# Patient Record
Sex: Female | Born: 1955 | Race: White | Hispanic: No | Marital: Married | Smoking: Never smoker
Health system: Southern US, Community
[De-identification: ages and names within clinical notes are randomized; demographics above are authoritative.]

## PROBLEM LIST (undated history)

## (undated) DIAGNOSIS — M8580 Other specified disorders of bone density and structure, unspecified site: Secondary | ICD-10-CM

## (undated) DIAGNOSIS — E079 Disorder of thyroid, unspecified: Secondary | ICD-10-CM

## (undated) DIAGNOSIS — N393 Stress incontinence (female) (male): Secondary | ICD-10-CM

## (undated) DIAGNOSIS — M1990 Unspecified osteoarthritis, unspecified site: Secondary | ICD-10-CM

## (undated) DIAGNOSIS — Z780 Asymptomatic menopausal state: Secondary | ICD-10-CM

## (undated) DIAGNOSIS — R51 Headache: Secondary | ICD-10-CM

## (undated) DIAGNOSIS — N951 Menopausal and female climacteric states: Secondary | ICD-10-CM

## (undated) DIAGNOSIS — R198 Other specified symptoms and signs involving the digestive system and abdomen: Secondary | ICD-10-CM

## (undated) HISTORY — DX: Unspecified osteoarthritis, unspecified site: M19.90

## (undated) HISTORY — DX: Headache: R51

## (undated) HISTORY — DX: Other specified symptoms and signs involving the digestive system and abdomen: R19.8

## (undated) HISTORY — DX: Asymptomatic menopausal state: Z78.0

## (undated) HISTORY — PX: AUGMENTATION MAMMAPLASTY: SUR837

## (undated) HISTORY — DX: Disorder of thyroid, unspecified: E07.9

## (undated) HISTORY — DX: Other specified disorders of bone density and structure, unspecified site: M85.80

## (undated) HISTORY — PX: BREAST ENHANCEMENT SURGERY: SHX7

## (undated) HISTORY — DX: Stress incontinence (female) (male): N39.3

## (undated) HISTORY — DX: Menopausal and female climacteric states: N95.1

---

## 1998-03-27 ENCOUNTER — Encounter: Admission: RE | Admit: 1998-03-27 | Discharge: 1998-06-25 | Payer: Self-pay | Admitting: Radiation Oncology

## 1998-07-14 ENCOUNTER — Other Ambulatory Visit: Admission: RE | Admit: 1998-07-14 | Discharge: 1998-07-14 | Payer: Self-pay | Admitting: Obstetrics and Gynecology

## 1999-07-31 ENCOUNTER — Other Ambulatory Visit: Admission: RE | Admit: 1999-07-31 | Discharge: 1999-07-31 | Payer: Self-pay | Admitting: Obstetrics and Gynecology

## 2000-01-31 ENCOUNTER — Encounter: Payer: Self-pay | Admitting: Obstetrics and Gynecology

## 2000-01-31 ENCOUNTER — Encounter: Admission: RE | Admit: 2000-01-31 | Discharge: 2000-01-31 | Payer: Self-pay | Admitting: Obstetrics and Gynecology

## 2001-01-14 ENCOUNTER — Other Ambulatory Visit: Admission: RE | Admit: 2001-01-14 | Discharge: 2001-01-14 | Payer: Self-pay | Admitting: Obstetrics and Gynecology

## 2001-02-02 ENCOUNTER — Encounter: Admission: RE | Admit: 2001-02-02 | Discharge: 2001-02-02 | Payer: Self-pay | Admitting: Obstetrics and Gynecology

## 2001-02-02 ENCOUNTER — Encounter: Payer: Self-pay | Admitting: Obstetrics and Gynecology

## 2002-03-12 ENCOUNTER — Encounter: Admission: RE | Admit: 2002-03-12 | Discharge: 2002-03-12 | Payer: Self-pay | Admitting: Obstetrics and Gynecology

## 2002-03-12 ENCOUNTER — Encounter: Payer: Self-pay | Admitting: Obstetrics and Gynecology

## 2002-04-01 ENCOUNTER — Other Ambulatory Visit: Admission: RE | Admit: 2002-04-01 | Discharge: 2002-04-01 | Payer: Self-pay | Admitting: Obstetrics and Gynecology

## 2003-06-28 ENCOUNTER — Other Ambulatory Visit: Admission: RE | Admit: 2003-06-28 | Discharge: 2003-06-28 | Payer: Self-pay | Admitting: Obstetrics and Gynecology

## 2003-06-29 ENCOUNTER — Encounter: Admission: RE | Admit: 2003-06-29 | Discharge: 2003-06-29 | Payer: Self-pay | Admitting: Obstetrics and Gynecology

## 2004-07-30 ENCOUNTER — Other Ambulatory Visit: Admission: RE | Admit: 2004-07-30 | Discharge: 2004-07-30 | Payer: Self-pay | Admitting: Obstetrics and Gynecology

## 2004-08-03 ENCOUNTER — Encounter: Admission: RE | Admit: 2004-08-03 | Discharge: 2004-08-03 | Payer: Self-pay | Admitting: Obstetrics and Gynecology

## 2005-09-19 ENCOUNTER — Encounter: Admission: RE | Admit: 2005-09-19 | Discharge: 2005-09-19 | Payer: Self-pay | Admitting: Obstetrics and Gynecology

## 2006-09-24 ENCOUNTER — Encounter: Admission: RE | Admit: 2006-09-24 | Discharge: 2006-09-24 | Payer: Self-pay | Admitting: Obstetrics and Gynecology

## 2007-11-05 ENCOUNTER — Encounter: Admission: RE | Admit: 2007-11-05 | Discharge: 2007-11-05 | Payer: Self-pay | Admitting: Obstetrics and Gynecology

## 2008-11-30 ENCOUNTER — Encounter: Admission: RE | Admit: 2008-11-30 | Discharge: 2008-11-30 | Payer: Self-pay | Admitting: Obstetrics and Gynecology

## 2009-12-06 ENCOUNTER — Encounter: Admission: RE | Admit: 2009-12-06 | Discharge: 2009-12-06 | Payer: Self-pay | Admitting: Obstetrics and Gynecology

## 2010-05-06 ENCOUNTER — Encounter: Payer: Self-pay | Admitting: Obstetrics and Gynecology

## 2010-09-11 ENCOUNTER — Encounter: Payer: Self-pay | Admitting: Gastroenterology

## 2010-10-22 ENCOUNTER — Other Ambulatory Visit (INDEPENDENT_AMBULATORY_CARE_PROVIDER_SITE_OTHER): Payer: 59

## 2010-10-22 ENCOUNTER — Encounter: Payer: Self-pay | Admitting: Gastroenterology

## 2010-10-22 ENCOUNTER — Ambulatory Visit (INDEPENDENT_AMBULATORY_CARE_PROVIDER_SITE_OTHER): Payer: 59 | Admitting: Gastroenterology

## 2010-10-22 VITALS — BP 92/58 | HR 68 | Ht 69.0 in | Wt 126.0 lb

## 2010-10-22 DIAGNOSIS — R198 Other specified symptoms and signs involving the digestive system and abdomen: Secondary | ICD-10-CM | POA: Insufficient documentation

## 2010-10-22 DIAGNOSIS — K589 Irritable bowel syndrome without diarrhea: Secondary | ICD-10-CM

## 2010-10-22 LAB — IBC PANEL
Saturation Ratios: 31.7 % (ref 20.0–50.0)
Transferrin: 261.4 mg/dL (ref 212.0–360.0)

## 2010-10-22 LAB — CBC WITH DIFFERENTIAL/PLATELET
Eosinophils Absolute: 0.3 10*3/uL (ref 0.0–0.7)
Eosinophils Relative: 4.3 % (ref 0.0–5.0)
HCT: 36.7 % (ref 36.0–46.0)
Lymphs Abs: 1.7 10*3/uL (ref 0.7–4.0)
MCHC: 33.9 g/dL (ref 30.0–36.0)
MCV: 94.1 fl (ref 78.0–100.0)
Monocytes Absolute: 0.5 10*3/uL (ref 0.1–1.0)
Platelets: 216 10*3/uL (ref 150.0–400.0)
RDW: 13.6 % (ref 11.5–14.6)
WBC: 6 10*3/uL (ref 4.5–10.5)

## 2010-10-22 LAB — HEPATIC FUNCTION PANEL
AST: 28 U/L (ref 0–37)
Alkaline Phosphatase: 57 U/L (ref 39–117)
Bilirubin, Direct: 0.1 mg/dL (ref 0.0–0.3)
Total Bilirubin: 0.5 mg/dL (ref 0.3–1.2)

## 2010-10-22 LAB — TSH: TSH: 1.86 u[IU]/mL (ref 0.35–5.50)

## 2010-10-22 LAB — BASIC METABOLIC PANEL
CO2: 29 mEq/L (ref 19–32)
Chloride: 103 mEq/L (ref 96–112)
Potassium: 4.1 mEq/L (ref 3.5–5.1)
Sodium: 142 mEq/L (ref 135–145)

## 2010-10-22 LAB — FERRITIN: Ferritin: 32.9 ng/mL (ref 10.0–291.0)

## 2010-10-22 MED ORDER — PEG-KCL-NACL-NASULF-NA ASC-C 100 G PO SOLR: 1.0000 | Freq: Once | ORAL | Status: DC

## 2010-10-22 NOTE — Progress Notes (Signed)
History of Present Illness:  This is a 55 year old Caucasian female referred by Dr. Richardean Chimera evaluation of mild constipation and occasional tenesmus without rectal bleeding or abdominal pain. She has incomplete rectal emptying with several bowel movements each morning over the last year despite an increased fiber diet. She denies other gastrointestinal or general medical problems. Last colonoscopy was approximately 10 years ago. Family history is remarkable for multiple family members with diverticulosis.  I have reviewed this patient's present history, medical and surgical past history, allergies and medications.     ROS: The remainder of the 10 point ROS is negative     Physical Exam: General well developed well nourished patient in no acute distress, appearing her stated age Eyes PERRLA, no icterus, fundoscopic exam per opthamologist Skin no lesions noted Neck supple, no adenopathy, no thyroid enlargement, no tenderness Chest clear to percussion and auscultation Heart no significant murmurs, gallops or rubs noted Abdomen no hepatosplenomegaly masses or tenderness, BS normal. . Extremities no acute joint lesions, edema, phlebitis or evidence of cellulitis. Neurologic patient oriented x 3, cranial nerves intact, no focal neurologic deficits noted. Psychological mental status normal and normal affect.  Assessment and plan: Probable diverticulosis coli with incomplete rectal emptying. I scheduled her for colonoscopy with propofol sedation since her last colonoscopy required large doses of medications for adequate sedation. Placed on a high-fiber diet with daily Benefiber, and repeat labs which have not been done in several years. Encounter Diagnosis  Name Primary?  . Tenesmus Yes

## 2010-10-22 NOTE — Patient Instructions (Addendum)
Your procedure has been scheduled for 10/23/2010, please follow the seperate instructions.  Your prescription(s) have been sent to you pharmacy.  Please go to the basement today for your labs.  Buy Benefiber OTC and take once a day    High-Fiber Diet A high-fiber diet changes your normal diet to include more whole grains, legumes, fruits, and vegetables. Changes in the diet involve replacing refined carbohydrates with unrefined foods. The calorie level of the diet is essentially unchanged. The Dietary Reference Intake (recommended amount) for adult males is 38 grams per day. For adult females, it is 25 grams per day. Pregnant and lactating women should consume 28 grams of fiber per day. Fiber is the intact part of a plant that is not broken down during digestion. Functional fiber is fiber that has been isolated from the plant to provide a beneficial effect in the body. PURPOSE  Increase stool bulk.   Ease and regulate bowel movements.   Lower cholesterol.  INDICATIONS THAT YOU NEED MORE FIBER  Constipation and hemorrhoids.   Uncomplicated diverticulosis (intestine condition) and irritable bowel syndrome.   Weight management.   As a protective measure against hardening of the arteries (atherosclerosis), diabetes, and cancer.  NOTE OF CAUTION If you have a digestive or bowel problem, ask your caregiver for advice before adding high-fiber foods to your diet. Some of the following medical problems are such that a high-fiber diet should not be used without consulting your caregiver. DO NOT USE WITH:  Acute diverticulitis (intestine infection).   Partial small bowel obstructions.   Complicated diverticular disease involving bleeding, rupture (perforation), or abscess (boil, furuncle).   Presence of autonomic neuropathy (nerve damage) or gastric paresis (stomach cannot empty itself).  GUIDELINES FOR INCREASING FIBER IN THE DIET  Start adding fiber to the diet slowly. A gradual increase  of about 5 more grams (2 slices of whole-wheat bread, 2 servings of most fruits or vegetables, or 1 bowl of high-fiber cereal) per day is best. Too rapid an increase in fiber may result in constipation, flatulence, and bloating.   Drink enough water and fluids to keep your urine clear or pale yellow. Water, juice, or caffeine-free drinks are recommended. Not drinking enough fluid may cause constipation.   Eat a variety of high-fiber foods rather than one type of fiber.   Try to increase your intake of fiber through using high-fiber foods rather than fiber pills or supplements that contain small amounts of fiber.   The goal is to change the types of food eaten. Do not supplement your present diet with high-fiber foods, but replace foods in your present diet.  INCLUDE A VARIETY OF FIBER SOURCES  Replace refined and processed grains with whole grains, canned fruits with fresh fruits, and incorporate other fiber sources. White rice, white breads, and most bakery goods contain little or no fiber.   Brown whole-grain rice, buckwheat oats, and many fruits and vegetables are all good sources of fiber. These include: broccoli, Brussels sprouts, cabbage, cauliflower, beets, sweet potatoes, white potatoes (skin on), carrots, tomatoes, eggplant, squash, berries, fresh fruits, and dried fruits.   Cereals appear to be the richest source of fiber. Cereal fiber is found in whole grains and bran. Bran is the fiber-rich outer coat of cereal grain, which is largely removed in refining. In whole-grain cereals, the bran remains. In breakfast cereals, the largest amount of fiber is found in those with "bran" in their names. The fiber content is sometimes indicated on the label.   You  may need to include additional fruits and vegetables each day.   In baking, for 1 cup white flour, you may use the following substitutions:   1 cup whole-wheat flour minus 2 tablespoons.   1/2 cup white flour plus 1/2 cup whole-wheat  flour.  References: Dietary Reference Intakes: Recommended Intakes for Individuals. [employer]. Institute of Medicine. Food and Nutrition Board. Document Released: 04/01/2005 Document Re-Released: 06/26/2009 [facility] Patient Information 2011 [place], [place].

## 2010-10-23 ENCOUNTER — Encounter: Payer: Self-pay | Admitting: Gastroenterology

## 2010-10-23 ENCOUNTER — Ambulatory Visit (AMBULATORY_SURGERY_CENTER): Payer: 59 | Admitting: Gastroenterology

## 2010-10-23 VITALS — BP 118/73 | HR 62 | Temp 97.7°F | Resp 14 | Ht 69.0 in | Wt 126.0 lb

## 2010-10-23 DIAGNOSIS — K589 Irritable bowel syndrome without diarrhea: Secondary | ICD-10-CM

## 2010-10-23 DIAGNOSIS — R198 Other specified symptoms and signs involving the digestive system and abdomen: Secondary | ICD-10-CM

## 2010-10-23 DIAGNOSIS — R109 Unspecified abdominal pain: Secondary | ICD-10-CM

## 2010-10-23 LAB — CELIAC PANEL 10
Endomysial Screen: NEGATIVE
Gliadin IgG: 12.6 U/mL (ref <20)
Tissue Transglut Ab: 16.4 U/mL (ref <20)
Tissue Transglutaminase Ab, IgA: 4 U/mL (ref <20)

## 2010-10-23 MED ORDER — SODIUM CHLORIDE 0.9 % IV SOLN: 500.0000 mL | INTRAVENOUS | Status: AC

## 2010-10-23 NOTE — Patient Instructions (Signed)
Your next colonoscopy will be in 2022.  We will send you a reminder at that time.   If you have any questions or concerns, please call us at [phone].  Thank-you.

## 2010-10-24 ENCOUNTER — Telehealth: Payer: Self-pay

## 2010-10-24 NOTE — Telephone Encounter (Signed)

## 2010-11-22 ENCOUNTER — Other Ambulatory Visit: Payer: Self-pay | Admitting: Obstetrics and Gynecology

## 2010-11-22 DIAGNOSIS — Z1231 Encounter for screening mammogram for malignant neoplasm of breast: Secondary | ICD-10-CM

## 2010-12-12 ENCOUNTER — Ambulatory Visit
Admission: RE | Admit: 2010-12-12 | Discharge: 2010-12-12 | Disposition: A | Discharge status: Home / Self Care | Payer: BC Managed Care – PPO | Source: Ambulatory Visit | Attending: Obstetrics and Gynecology | Admitting: Obstetrics and Gynecology

## 2010-12-12 DIAGNOSIS — Z1231 Encounter for screening mammogram for malignant neoplasm of breast: Secondary | ICD-10-CM

## 2012-05-05 ENCOUNTER — Other Ambulatory Visit: Payer: Self-pay | Admitting: Internal Medicine

## 2012-05-05 DIAGNOSIS — M79641 Pain in right hand: Secondary | ICD-10-CM

## 2012-05-12 ENCOUNTER — Other Ambulatory Visit: Payer: BC Managed Care – PPO

## 2012-06-03 ENCOUNTER — Ambulatory Visit
Admission: RE | Admit: 2012-06-03 | Discharge: 2012-06-03 | Disposition: A | Discharge status: Home / Self Care | Payer: BC Managed Care – PPO | Source: Ambulatory Visit | Attending: Internal Medicine | Admitting: Internal Medicine

## 2012-06-03 ENCOUNTER — Other Ambulatory Visit: Payer: Self-pay | Admitting: Internal Medicine

## 2012-06-03 DIAGNOSIS — M79641 Pain in right hand: Secondary | ICD-10-CM

## 2012-06-03 MED ORDER — GADOBENATE DIMEGLUMINE 529 MG/ML IV SOLN
11.0000 mL | Freq: Once | INTRAVENOUS | Status: AC | PRN
  Administered 2012-06-03: 11 mL via INTRAVENOUS

## 2014-04-15 HISTORY — PX: MENISCUS REPAIR: SHX5179

## 2015-06-20 ENCOUNTER — Other Ambulatory Visit: Payer: Self-pay | Admitting: Obstetrics and Gynecology

## 2015-06-20 DIAGNOSIS — N644 Mastodynia: Secondary | ICD-10-CM

## 2015-06-23 ENCOUNTER — Other Ambulatory Visit: Payer: Self-pay

## 2015-06-26 ENCOUNTER — Ambulatory Visit
Admission: RE | Admit: 2015-06-26 | Discharge: 2015-06-26 | Disposition: A | Discharge status: Home / Self Care | Payer: BLUE CROSS/BLUE SHIELD | Source: Ambulatory Visit | Attending: Obstetrics and Gynecology | Admitting: Obstetrics and Gynecology

## 2015-06-26 ENCOUNTER — Other Ambulatory Visit: Payer: Self-pay | Admitting: Obstetrics and Gynecology

## 2015-06-26 DIAGNOSIS — N644 Mastodynia: Secondary | ICD-10-CM

## 2015-11-13 ENCOUNTER — Other Ambulatory Visit: Payer: Self-pay | Admitting: Obstetrics and Gynecology

## 2015-11-13 DIAGNOSIS — N6320 Unspecified lump in the left breast, unspecified quadrant: Secondary | ICD-10-CM

## 2015-11-17 ENCOUNTER — Ambulatory Visit
Admission: RE | Admit: 2015-11-17 | Discharge: 2015-11-17 | Disposition: A | Discharge status: Home / Self Care | Payer: BLUE CROSS/BLUE SHIELD | Source: Ambulatory Visit | Attending: Obstetrics and Gynecology | Admitting: Obstetrics and Gynecology

## 2015-11-17 ENCOUNTER — Other Ambulatory Visit: Payer: Self-pay | Admitting: Obstetrics and Gynecology

## 2015-11-17 DIAGNOSIS — N6320 Unspecified lump in the left breast, unspecified quadrant: Secondary | ICD-10-CM

## 2018-01-22 ENCOUNTER — Other Ambulatory Visit: Payer: Self-pay | Admitting: Obstetrics and Gynecology

## 2018-01-22 DIAGNOSIS — R0989 Other specified symptoms and signs involving the circulatory and respiratory systems: Secondary | ICD-10-CM

## 2018-01-28 ENCOUNTER — Other Ambulatory Visit: Payer: Self-pay | Admitting: Obstetrics and Gynecology

## 2018-01-28 DIAGNOSIS — R0989 Other specified symptoms and signs involving the circulatory and respiratory systems: Secondary | ICD-10-CM

## 2018-02-02 ENCOUNTER — Other Ambulatory Visit: Payer: BLUE CROSS/BLUE SHIELD

## 2018-02-05 ENCOUNTER — Ambulatory Visit
Admission: RE | Admit: 2018-02-05 | Discharge: 2018-02-05 | Disposition: A | Discharge status: Home / Self Care | Payer: BLUE CROSS/BLUE SHIELD | Source: Ambulatory Visit | Attending: Obstetrics and Gynecology | Admitting: Obstetrics and Gynecology

## 2018-02-05 ENCOUNTER — Other Ambulatory Visit: Payer: BLUE CROSS/BLUE SHIELD

## 2018-02-05 DIAGNOSIS — R0989 Other specified symptoms and signs involving the circulatory and respiratory systems: Secondary | ICD-10-CM

## 2018-12-02 ENCOUNTER — Other Ambulatory Visit: Payer: Self-pay

## 2018-12-02 DIAGNOSIS — Z20822 Contact with and (suspected) exposure to covid-19: Secondary | ICD-10-CM

## 2018-12-03 LAB — NOVEL CORONAVIRUS, NAA: SARS-CoV-2, NAA: NOT DETECTED

## 2019-02-12 ENCOUNTER — Other Ambulatory Visit: Payer: Self-pay

## 2019-02-12 ENCOUNTER — Ambulatory Visit: Payer: BC Managed Care – PPO | Attending: Neurosurgery | Admitting: Physical Therapy

## 2019-02-12 DIAGNOSIS — M6281 Muscle weakness (generalized): Secondary | ICD-10-CM

## 2019-02-12 DIAGNOSIS — M5442 Lumbago with sciatica, left side: Secondary | ICD-10-CM | POA: Insufficient documentation

## 2019-02-12 NOTE — Therapy (Signed)
[facility] [place], [place], [address] Phone: [phone]   Fax:  [phone]  Physical Therapy Evaluation  Patient Details  Name: Cindy Conner MRN: [id number] Date of Birth: [birth year] Referring Provider (PT): Newman Pies MD   Encounter Date: 02/12/2019  PT End of Session - 02/12/19 0906    Visit Number  1    Number of Visits  12    Date for PT Re-Evaluation  03/26/19    Authorization Type  BCBS    PT Start Time  [phone]    PT Stop Time  0930    PT Time Calculation (min)  34 min    Activity Tolerance  Patient tolerated treatment well    Behavior During Therapy  [facility] for tasks assessed/performed       Past Medical History:  Diagnosis Date  . Headache   . Perimenopausal vasomotor symptoms   . Postmenopausal   . Stress incontinence, female   . Tenesmus     Past Surgical History:  Procedure Laterality Date  . BREAST ENHANCEMENT SURGERY      There were no vitals filed for this visit.   Subjective Assessment - 02/12/19 0859    Subjective  I have the most pain riding in car and standing for long time at my home for work.  Ihave had pain for about 6 months. I tend to work out a lot doing with wts and cardio and plyo metrics    Pertinent History  left knee pain surgery, torn menicus Dr [facility]emi [facility], > 5 years ago    Limitations  Sitting;Standing    How long can you sit comfortably?  10-15 minutes    How long can you stand comfortably?  dull after 1 hour    How long can you walk comfortably?  walk comfortably most of time    Diagnostic tests  no test this session    Patient Stated Goals  to stop nerve    Currently in Pain?  Yes    Pain Score  2    at worst it is a 9/10   Pain Location  Back    Pain Orientation  Left    Pain Descriptors / Indicators  Throbbing;Shooting    Pain Type  Chronic pain    Pain Radiating Towards  toward Left knee    Pain Onset  More than a month ago    Pain Frequency   Intermittent    Aggravating Factors   driving,    Pain Relieving Factors  yoga         [facility] PT Assessment - 02/12/19 0907      Assessment   Medical Diagnosis  Left low back pain with left sciatica    Referring Provider (PT)  Newman Pies MD    Onset Date/Surgical Date  --   49months ago   Hand Dominance  Right    Next MD Visit  no follow up    Prior Therapy  none      Balance Screen   Has the patient fallen in the past 6 months  No    Has the patient had a decrease in activity level because of a fear of falling?   No    Is the patient reluctant to leave their home because of a fear of falling?   No      Home Film/video editor residence    Living Arrangements  Spouse/significant other    Available  Help at Discharge  Family;Friend(s)    Type of [place] to enter    Entrance Stairs-Number of Steps  2    Entrance Stairs-Rails  None    Home Layout  Two level      Prior Function   Level of Independence  Independent      Cognition   Overall Cognitive Status  Within Functional Limits for tasks assessed      Observation/Other Assessments   Focus on Therapeutic Outcomes (FOTO)   FOTO  arrived late and did not give FOTO      Posture/Postural Control   Posture/Postural Control  Postural limitations    Postural Limitations  Posterior pelvic tilt;Decreased lumbar lordosis;Weight shift right    Posture Comments  Left shoulder elevated over right and neck is angled to the right, slightly elevated left pelvis       ROM / Strength   AROM / PROM / Strength  AROM;Strength      AROM   Overall AROM   Deficits    Lumbar Flexion  90% available Range   able to touch the floor no pain   Lumbar Extension  90% available range    Lumbar - Right Side Bend  19 inches from ffinger tip to floor    Lumbar - Left Side Bend  20.5 inches from floor    Lumbar - Right Rotation  75% available range    Lumbar - Left Rotation  75 % available range       Strength   Overall Strength  Deficits    Right Hip Flexion  4+/5    Right Hip Extension  4+/5    Right Hip ABduction  4+/5    Left Hip Flexion  4/5    Left Hip Extension  4-/5    Left Hip ABduction  4-/5    Lumbar Flexion  4+/5    Lumbar Extension  3-/5   can only handle 10 sec for lumbar ext, should be 1 min minim     Flexibility   Soft Tissue Assessment /Muscle Length  yes    Hamstrings  Pt RT 90, LT 80      Palpation   Palpation comment  tenderness over left lumbar paraspinals                Objective measurements completed on examination: See above findings.      [facility] Adult PT Treatment/Exercise - 02/12/19 0907      Knee/Hip Exercises: Stretches   Piriformis Stretch  2 reps;30 seconds;Left;Right    Other Knee/Hip Stretches  sciatic nerve glide supine and in standing. 10 x each.             PT Education - 02/12/19 1224    Education Details  POC Explanation of findings.  Initial HEP    Person(s) Educated  Patient    Methods  Explanation;Demonstration;Tactile cues;Verbal cues;Handout    Comprehension  Verbalized understanding;Returned demonstration       PT Short Term Goals - 02/12/19 0933      PT SHORT TERM GOAL #1   Title  STG=LTG        PT Long Term Goals - 02/12/19 0935      PT LONG TERM GOAL #1   Title  Pt will be independent with advanced HEP.    Baseline  no knowledge    Time  6    Period  Weeks    Status  New  Target Date  03/26/19      PT LONG TERM GOAL #2   Title  Pt will tolerate sitting 1 hour without increased pain to ride in car without increased pain    Baseline  Pt cannot tolerate longer than 10-15 min at eval    Time  6    Period  Weeks    Status  New    Target Date  03/26/19      PT LONG TERM GOAL #3   Title  Pt will improve L/ hip/back extensor/flexor strength to >/= 4+/5 with no exacerbation of pain to promote strength with transitional movements from car to standing    Baseline  Pt unable to tolerate  back extention test for longer than 10 sec.  Pt should be at least a minute,    Time  6    Period  Weeks    Status  New    Target Date  03/26/19      PT LONG TERM GOAL #4   Title  Sit and stand with RT=LT wt bearing to reduce lumbar strain and allow for increased tolerance for these positions for work tasks    Baseline  bears wt in RLE in coming to stand    Time  6    Period  Weeks    Status  New    Target Date  03/26/19             Plan - 02/12/19 1052    Clinical Impression Statement  63 yo female with c/o of increased LBP especially when sitting  and occasional radicular pain into L knee for about 6 months. Pt presents with signs and symptoms compatible with lumbar radiculopathy. Pt with asymmetrical hip/piriformis flexibilty. Pt reports left knee pain but slightly tighter hamstring on LT, but pt with ectomorphic body type and some indications of hypermobilty. Strengthening will be main goal.  Pt also with Pt would benefit from skilled PT for 2 times a week for 6 weeks to address above impariments and functional limitations and return to pain-free PLOF.    Examination-Activity Limitations  Sit    Stability/Clinical Decision Making  Stable/Uncomplicated    Clinical Decision Making  Low    Rehab Potential  Good    PT Frequency  2x / week    PT Duration  6 weeks    PT Treatment/Interventions  Cryotherapy;Therapeutic activities;Electrical Stimulation;Iontophoresis 4mg /ml Dexamethasone;Moist Heat;Traction;Ultrasound;Neuromuscular re-education;Therapeutic exercise;Patient/family education;Manual techniques;Taping;Dry needling;Passive range of motion    PT Next Visit Plan  Give iniital HEP deadlifts and hip hinging  and try TPDN.    PT Home Exercise Plan  sciatic nerve glide supine and standing    Consulted and Agree with Plan of Care  Patient       Patient will benefit from skilled therapeutic intervention in order to improve the following deficits and impairments:  Decreased safety  awareness  Visit Diagnosis: Left-sided low back pain with left-sided sciatica, unspecified chronicity  Muscle weakness (generalized)     Problem List Patient Active Problem List   Diagnosis Date Noted  . Other symptoms involving digestive system(787.99) 10/23/2010  . Tenesmus 10/22/2010  . IBS (irritable bowel syndrome) 10/22/2010   Voncille Lo, PT Certified Exercise Expert for the Aging Adult  02/12/19 12:34 PM Phone: [phone] Fax: [facility] [address] [place], [place], [address] Phone: [phone]   Fax:  [phone]  Name: SYMIA DETEMPLE MRN: [id number] Date of Birth: May 05, 1955

## 2019-02-12 NOTE — Patient Instructions (Signed)
     Voncille Lo, PT Certified Exercise Expert for the Aging Adult  02/12/19 9:32 AM Phone: [phone] Fax: [phone]

## 2019-02-23 ENCOUNTER — Encounter: Payer: Self-pay | Admitting: Physical Therapy

## 2019-02-23 ENCOUNTER — Other Ambulatory Visit: Payer: Self-pay

## 2019-02-23 ENCOUNTER — Ambulatory Visit: Payer: BC Managed Care – PPO | Attending: Neurosurgery | Admitting: Physical Therapy

## 2019-02-23 DIAGNOSIS — M6281 Muscle weakness (generalized): Secondary | ICD-10-CM | POA: Diagnosis present

## 2019-02-23 DIAGNOSIS — M5442 Lumbago with sciatica, left side: Secondary | ICD-10-CM

## 2019-02-23 NOTE — Patient Instructions (Addendum)
Trigger Point Dry Needling  . What is Trigger Point Dry Needling (DN)? o DN is a physical therapy technique used to treat muscle pain and dysfunction. Specifically, DN helps deactivate muscle trigger points (muscle knots).  o A thin filiform needle is used to penetrate the skin and stimulate the underlying trigger point. The goal is for a local twitch response (LTR) to occur and for the trigger point to relax. No medication of any kind is injected during the procedure.   . What Does Trigger Point Dry Needling Feel Like?  o The procedure feels different for each individual patient. Some patients report that they do not actually feel the needle enter the skin and overall the process is not painful. Very mild bleeding may occur. However, many patients feel a deep cramping in the muscle in which the needle was inserted. This is the local twitch response.   Marland Kitchen How Will I feel after the treatment? o Soreness is normal, and the onset of soreness may not occur for a few hours. Typically this soreness does not last longer than two days.  o Bruising is uncommon, however; ice can be used to decrease any possible bruising.  o In rare cases feeling tired or nauseous after the treatment is normal. In addition, your symptoms may get worse before they get better, this period will typically not last longer than 24 hours.   . What Can I do After My Treatment? o Increase your hydration by drinking more water for the next 24 hours. o You may place ice or heat on the areas treated that have become sore, however, do not use heat on inflamed or bruised areas. Heat often brings more relief post needling. o You can continue your regular activities, but vigorous activity is not recommended initially after the treatment for 24 hours. o DN is best combined with other physical therapy such as strengthening, stretching, and other therapies.         Cindy Conner, PT Certified Exercise Expert for the Aging Adult   02/23/19 11:52 AM Phone: [phone] Fax: [phone]

## 2019-02-23 NOTE — Therapy (Signed)
[facility], [place], [address] Phone: [phone]   Fax:  [phone]  Physical Therapy Treatment  Patient Details  Name: Cindy Conner MRN: [id number] Date of Birth: 03-21-1956 Referring Provider (PT): Newman Pies MD   Encounter Date: 02/23/2019  PT End of Session - 02/23/19 1152    Visit Number  2    Number of Visits  12    Date for PT Re-Evaluation  03/26/19    Authorization Type  BCBS    PT Start Time  1151    PT Stop Time  1245    PT Time Calculation (min)  54 min    Activity Tolerance  Patient tolerated treatment well    Behavior During Therapy  [facility] for tasks assessed/performed       Past Medical History:  Diagnosis Date  . Headache(784.0)   . Perimenopausal vasomotor symptoms   . Postmenopausal   . Stress incontinence, female   . Tenesmus     Past Surgical History:  Procedure Laterality Date  . BREAST ENHANCEMENT SURGERY      There were no vitals filed for this visit.                    [facility] Adult PT Treatment/Exercise - 02/23/19 0001      Self-Care   Self-Care  Other Self-Care Comments    Other Self-Care Comments   education on TPDN and aftercare and precautians      Lumbar Exercises: Standing   Other Standing Lumbar Exercises  hip hinge with dowel 2 x 10    Other Standing Lumbar Exercises  deadlift with 25 lb KB x 5, wit 10 lb x 10  with VC and TC for correct execution      Lumbar Exercises: Quadruped   Other Quadruped Lumbar Exercises  camel to childs pose x 10      Knee/Hip Exercises: Stretches   Other Knee/Hip Stretches  sciatic nerve glide supine and in standing. 10 x each.      Modalities   Modalities  Moist Heat      Moist Heat Therapy   Number Minutes Moist Heat  14 Minutes    Moist Heat Location  Lumbar Spine   QL     Manual Therapy   Manual Therapy  Soft tissue mobilization    Manual therapy comments  skilled palpation for TPDN    Soft tissue mobilization  Bil Quadratus Lumborum and bil lumbar paraspinals        Trigger Point Dry Needling - 02/23/19 0001    Consent Given?  Yes    Education Handout Provided  Yes    Muscles Treated Back/Hip  Quadratus lumborum;Erector spinae   Bil   Other Dry Needling  75 mm  60 mm     Erector spinae Response  Twitch response elicited;Palpable increased muscle length   bil L-5/S-1   Quadratus Lumborum Response  Twitch response elicited;Palpable increased muscle length           PT Education - 02/23/19 1236    Education Details  added hip hinge, deadlift and childs pose/camel to HEP education on TPDN    Person(s) Educated  Patient    Methods  Explanation;Demonstration;Tactile cues;Verbal cues;Handout    Comprehension  Verbalized understanding;Returned demonstration       PT Short Term Goals - 02/12/19 0933      PT SHORT TERM GOAL #1   Title  STG=LTG  PT Long Term Goals - 02/12/19 0935      PT LONG TERM GOAL #1   Title  Pt will be independent with advanced HEP.    Baseline  no knowledge    Time  6    Period  Weeks    Status  New    Target Date  03/26/19      PT LONG TERM GOAL #2   Title  Pt will tolerate sitting 1 hour without increased pain to ride in car without increased pain    Baseline  Pt cannot tolerate longer than 10-15 min at eval    Time  6    Period  Weeks    Status  New    Target Date  03/26/19      PT LONG TERM GOAL #3   Title  Pt will improve L/ hip/back extensor/flexor strength to >/= 4+/5 with no exacerbation of pain to promote strength with transitional movements from car to standing    Baseline  Pt unable to tolerate back extention test for longer than 10 sec.  Pt should be at least a minute,    Time  6    Period  Weeks    Status  New    Target Date  03/26/19      PT LONG TERM GOAL #4   Title  Sit and stand with RT=LT wt bearing to reduce lumbar strain and allow for increased tolerance for these positions for work tasks     Baseline  bears wt in RLE in coming to stand    Time  6    Period  Weeks    Status  New    Target Date  03/26/19          Access Code: [email or web address]  URL: [email or web address]  Date: 02/23/2019  Prepared by: Voncille Lo   Exercises  Kettlebell Deadlift - 10 reps - 3 sets - 1x daily - 7x weekly  Standing Hip Hinge with Dowel - 10 reps - 3 sets - 1x daily - 7x weekly  Cat-Camel to Child's Pose - 10 reps - 3 sets - 1x daily - 7x weekly     Plan - 02/23/19 1237    Clinical Impression Statement  Pt returns to cliniic and has c/o of pain from golfing.  No real difference from eval this week.  Added strengthening to HEP and educated on TPDN. Pt consented to TPDN and was closely monitored throughout session.  Pt with decreased muscle tissue tension post session. Will continue POC    Examination-Activity Limitations  Sit    Stability/Clinical Decision Making  Stable/Uncomplicated    Clinical Decision Making  Low    Rehab Potential  Good    PT Frequency  2x / week    PT Treatment/Interventions  Cryotherapy;Therapeutic activities;Electrical Stimulation;Iontophoresis 4mg /ml Dexamethasone;Moist Heat;Traction;Ultrasound;Neuromuscular re-education;Therapeutic exercise;Patient/family education;Manual techniques;Taping;Dry needling;Passive range of motion    PT Next Visit Plan  assess TPDN and add piriformis stretch, review HEP deadlifts/hip hinge    PT Home Exercise Plan  sciatic nerve glide supine and standing   [email or web address]   Consulted and Agree with Plan of Care  Patient       Patient will benefit from skilled therapeutic intervention in order to improve the following deficits and impairments:  Decreased safety awareness  Visit Diagnosis: Left-sided low back pain with left-sided sciatica, unspecified chronicity  Muscle weakness (generalized)     Problem List Patient Active Problem List   Diagnosis Date Noted  . Other symptoms  involving digestive system(787.99)  10/23/2010  . Tenesmus 10/22/2010  . IBS (irritable bowel syndrome) 10/22/2010    Dorothea Ogle 02/23/2019, 12:48 PM  [facility] [address] [place], [place], [address] Phone: [phone]   Fax:  [phone]  Name: Cindy Conner MRN: [id number] Date of Birth: [birth year]

## 2019-02-26 ENCOUNTER — Ambulatory Visit: Payer: BC Managed Care – PPO | Admitting: Physical Therapy

## 2019-02-26 ENCOUNTER — Other Ambulatory Visit: Payer: Self-pay

## 2019-02-26 ENCOUNTER — Encounter: Payer: Self-pay | Admitting: Physical Therapy

## 2019-02-26 DIAGNOSIS — M5442 Lumbago with sciatica, left side: Secondary | ICD-10-CM

## 2019-02-26 DIAGNOSIS — M6281 Muscle weakness (generalized): Secondary | ICD-10-CM

## 2019-02-26 NOTE — Therapy (Signed)
[place], [place], [address] Phone: [phone]   Fax:  [phone]  Physical Therapy Treatment  Patient Details  Name: Cindy Conner MRN: [id number] Date of Birth: 01/24/1956 Referring Provider (PT): Newman Pies MD   Encounter Date: 02/26/2019  PT End of Session - 02/26/19 0806    Visit Number  3    Number of Visits  12    Date for PT Re-Evaluation  03/26/19    Authorization Type  BCBS    PT Start Time  0802    PT Stop Time  0840    PT Time Calculation (min)  38 min       Past Medical History:  Diagnosis Date  . Headache(784.0)   . Perimenopausal vasomotor symptoms   . Postmenopausal   . Stress incontinence, female   . Tenesmus     Past Surgical History:  Procedure Laterality Date  . BREAST ENHANCEMENT SURGERY      There were no vitals filed for this visit.  Subjective Assessment - 02/26/19 0804    Subjective  More right sided pain in low back than on the left. No radiating pain. I was able to play golf without pain after I left the last session.    Currently in Pain?  Yes    Pain Score  2     Pain Location  Back    Pain Orientation  Right    Pain Descriptors / Indicators  Aching    Pain Type  Chronic pain    Aggravating Factors   driving    Pain Relieving Factors  yoga                       [facility] Adult PT Treatment/Exercise - 02/26/19 0001      Lumbar Exercises: Standing   Other Standing Lumbar Exercises  hip hinge with dowel 2 x 10    Other Standing Lumbar Exercises  Deadlift 10# x 10 needs cues for neck alignment, 25# KB x 10 , cues for gluteal squeeze       Lumbar Exercises: Quadruped   Other Quadruped Lumbar Exercises  camel to childs pose x 10      Knee/Hip Exercises: Stretches   Piriformis Stretch  2 reps;30 seconds;Left;Right    Piriformis Stretch Limitations  figure 4 push and pull, given sitting and supine options     Other Knee/Hip Stretches   sciatic nerve glide supine and in standing. 10 x each.             PT Education - 02/26/19 0845    Education Details  HEP    Person(s) Educated  Patient    Methods  Explanation;Handout    Comprehension  Verbalized understanding       PT Short Term Goals - 02/12/19 0933      PT SHORT TERM GOAL #1   Title  STG=LTG        PT Long Term Goals - 02/12/19 0935      PT LONG TERM GOAL #1   Title  Pt will be independent with advanced HEP.    Baseline  no knowledge    Time  6    Period  Weeks    Status  New    Target Date  03/26/19      PT LONG TERM GOAL #2   Title  Pt will tolerate sitting 1 hour without increased pain to ride in car without increased pain  Baseline  Pt cannot tolerate longer than 10-15 min at eval    Time  6    Period  Weeks    Status  New    Target Date  03/26/19      PT LONG TERM GOAL #3   Title  Pt will improve L/ hip/back extensor/flexor strength to >/= 4+/5 with no exacerbation of pain to promote strength with transitional movements from car to standing    Baseline  Pt unable to tolerate back extention test for longer than 10 sec.  Pt should be at least a minute,    Time  6    Period  Weeks    Status  New    Target Date  03/26/19      PT LONG TERM GOAL #4   Title  Sit and stand with RT=LT wt bearing to reduce lumbar strain and allow for increased tolerance for these positions for work tasks    Baseline  bears wt in RLE in coming to stand    Time  6    Period  Weeks    Status  New    Target Date  03/26/19            Plan - 02/26/19 0846    Clinical Impression Statement  Pt arrives reporting decreased pain after TPDN last session. Reviewed HEP and progressed with Piriformis and hamstring stretches. Updated HEP. She required cues for cervical alignment during hip hinge and dead lifting.    PT Next Visit Plan  continue  TPDN and review  piriformis/hamstring stretch, review HEP deadlifts/hip hinge       Patient will benefit from  skilled therapeutic intervention in order to improve the following deficits and impairments:  Decreased safety awareness  Visit Diagnosis: Left-sided low back pain with left-sided sciatica, unspecified chronicity  Muscle weakness (generalized)     Problem List Patient Active Problem List   Diagnosis Date Noted  . Other symptoms involving digestive system(787.99) 10/23/2010  . Tenesmus 10/22/2010  . IBS (irritable bowel syndrome) 10/22/2010    Dorene Ar, PTA 02/26/2019, 8:49 AM  [place], [place], [address] Phone: [phone]   Fax:  [phone]  Name: Cindy Conner MRN: [id number] Date of Birth: 11/11/1955

## 2019-03-02 ENCOUNTER — Other Ambulatory Visit: Payer: Self-pay

## 2019-03-02 ENCOUNTER — Encounter: Payer: Self-pay | Admitting: Physical Therapy

## 2019-03-02 ENCOUNTER — Ambulatory Visit: Payer: BC Managed Care – PPO | Admitting: Physical Therapy

## 2019-03-02 DIAGNOSIS — M6281 Muscle weakness (generalized): Secondary | ICD-10-CM

## 2019-03-02 DIAGNOSIS — M5442 Lumbago with sciatica, left side: Secondary | ICD-10-CM

## 2019-03-02 NOTE — Therapy (Signed)
[place], [place], [address] Phone: [phone]   Fax:  [phone]  Physical Therapy Treatment  Patient Details  Name: Cindy Conner MRN: [id number] Date of Birth: Jun 02, 1955 Referring Provider (PT): Newman Pies MD   Encounter Date: 03/02/2019  PT End of Session - 03/02/19 1035    Visit Number  4    Number of Visits  12    Date for PT Re-Evaluation  03/26/19    Authorization Type  BCBS    PT Start Time  1017    PT Stop Time  1110    PT Time Calculation (min)  53 min    Activity Tolerance  Patient tolerated treatment well    Behavior During Therapy  [facility] for tasks assessed/performed       Past Medical History:  Diagnosis Date  . Headache(784.0)   . Perimenopausal vasomotor symptoms   . Postmenopausal   . Stress incontinence, female   . Tenesmus     Past Surgical History:  Procedure Laterality Date  . BREAST ENHANCEMENT SURGERY      There were no vitals filed for this visit.  Subjective Assessment - 03/02/19 1020    Subjective  No pain on my left side side today,  Today  no pain and I have done a class [employer] 3 yoga. Pain was on Left sciatica  no pain today. but a slight twinge on RT    Pertinent History  left knee pain surgery, torn menicus Dr [facility], > 5 years ago    Limitations  Sitting;Standing    How long can you sit comfortably?  10-15 minutes    How long can you stand comfortably?  dull after 1 hour    How long can you walk comfortably?  walk comfortably most of time    Diagnostic tests  no test this session    Patient Stated Goals  to stop nerve    Currently in Pain?  No/denies    Pain Score  1     Pain Location  Back    Pain Orientation  Right    Pain Descriptors / Indicators  Aching    Pain Type  Chronic pain    Pain Onset  More than a month ago         [facility] PT Assessment - 03/02/19 0001      Assessment   Medical Diagnosis  Left low back pain with left  sciatica    Referring Provider (PT)  Newman Pies MD      Strength   Overall Strength Comments  Back ext off mat at waste maintained for 1 min 20 sec    Right Hip Flexion  5/5    Right Hip Extension  5/5    Right Hip ABduction  5/5    Left Hip Flexion  4+/5    Left Hip Extension  4/5    Left Hip ABduction  4/5    Lumbar Flexion  4+/5    Lumbar Extension  4+/5                   [facility] Adult PT Treatment/Exercise - 03/02/19 0001      Self-Care   Self-Care  Other Self-Care Comments    Other Self-Care Comments   community resources       Lumbar Exercises: Standing   Other Standing Lumbar Exercises  squat with 25#KB    Other Standing Lumbar Exercises  Deadlift 25# x 15 needs  cues for alignment, 25# KB x 10 , cues for gluteal squeeze , on 6 in step Deadlift 30# 5 x rest then 5 x  then on 6 inch step #45 Deadlift 5 x      Lumbar Exercises: Quadruped   Other Quadruped Lumbar Exercises  camel to childs pose x 10      Knee/Hip Exercises: Stretches   Other Knee/Hip Stretches  sciatic nerve glide supine and in standing. 10 x each.      Knee/Hip Exercises: Machines for Strengthening   Total Gym Leg Press  4 plates  30 lb      Moist Heat Therapy   Number Minutes Moist Heat  12 Minutes    Moist Heat Location  Lumbar Spine   prone     Manual Therapy   Manual Therapy  Soft tissue mobilization    Manual therapy comments  skilled palpation for TPDN    Soft tissue mobilization  RT paraspinal and QL       Trigger Point Dry Needling - 03/02/19 0001    Consent Given?  Yes    Education Handout Provided  Previously provided    Muscles Treated Back/Hip  Quadratus lumborum;Erector spinae   RT only   Other Dry Needling  60 mm    Erector spinae Response  Twitch response elicited;Palpable increased muscle length   L4 to S-1   Quadratus Lumborum Response  Twitch response elicited;Palpable increased muscle length             PT Short Term Goals - 02/12/19 0933      PT  SHORT TERM GOAL #1   Title  STG=LTG        PT Long Term Goals - 03/02/19 1048      PT LONG TERM GOAL #1   Title  Pt will be independent with advanced HEP.    Baseline  working on advanced back strengthening    Time  6    Period  Weeks    Status  On-going      PT LONG TERM GOAL #2   Title  Pt will tolerate sitting 1 hour without increased pain to ride in car without increased pain    Baseline  utilizing sciatic nerve glide but sciatica 50% better, can sit at desk longer    Time  6    Period  Weeks    Status  On-going      PT LONG TERM GOAL #3   Title  Pt will improve L/ hip/back extensor/flexor strength to >/= 4+/5 with no exacerbation of pain to promote strength with transitional movements from car to standing no pain with transitional movements    Baseline  10 sec on eval   now 1 minute 20 sec    Time  6    Period  Weeks    Status  Achieved      PT LONG TERM GOAL #4   Title  Sit and stand with RT=LT wt bearing to reduce lumbar strain and allow for increased tolerance for these positions for work tasks    Baseline  able to bear symmetrically    Time  6    Period  Weeks    Status  Achieved            Plan - 03/02/19 1254    Clinical Impression Statement  Pt arrives and reports no sciatic pain in LT but a twinge 1/10 on RT.  Pt is able to maintain back extension over mat  for 1 minute/20 sec showing improvement from 10 sec. Pt LTG's #3 and # 4 achieved. working towards finalizing back HEP and [employer]. Contineu POC and finalize HEP    Examination-Activity Limitations  Sit    Stability/Clinical Decision Making  Stable/Uncomplicated    Clinical Decision Making  Low    Rehab Potential  Good    PT Frequency  2x / week    PT Duration  6 weeks    PT Treatment/Interventions  Cryotherapy;Therapeutic activities;Electrical Stimulation;Iontophoresis 4mg /ml Dexamethasone;Moist Heat;Traction;Ultrasound;Neuromuscular re-education;Therapeutic exercise;Patient/family  education;Manual techniques;Taping;Dry needling;Passive range of motion    PT Next Visit Plan  finalize HEP for home use, gym equipment and DN as needed and return to communtity wellness    PT Home Exercise Plan  sciatic nerve glide supine and standing hip hinge, deadlift    Consulted and Agree with Plan of Care  Patient       Patient will benefit from skilled therapeutic intervention in order to improve the following deficits and impairments:  Decreased safety awareness  Visit Diagnosis: Left-sided low back pain with left-sided sciatica, unspecified chronicity  Muscle weakness (generalized)     Problem List Patient Active Problem List   Diagnosis Date Noted  . Other symptoms involving digestive system(787.99) 10/23/2010  . Tenesmus 10/22/2010  . IBS (irritable bowel syndrome) 10/22/2010    Voncille Lo, PT Certified Exercise Expert for the Aging Adult  03/02/19 1:01 PM Phone: [phone] Fax: [facility] [address] [place], [place], [address] Phone: [phone]   Fax:  [phone]  Name: SAMRAWIT VANDENHEUVEL MRN: [id number] Date of Birth: [birth year]

## 2019-03-04 ENCOUNTER — Ambulatory Visit: Payer: BC Managed Care – PPO | Admitting: Physical Therapy

## 2019-03-04 ENCOUNTER — Encounter: Payer: Self-pay | Admitting: Physical Therapy

## 2019-03-04 ENCOUNTER — Other Ambulatory Visit: Payer: Self-pay

## 2019-03-04 DIAGNOSIS — M5442 Lumbago with sciatica, left side: Secondary | ICD-10-CM | POA: Diagnosis not present

## 2019-03-04 DIAGNOSIS — M6281 Muscle weakness (generalized): Secondary | ICD-10-CM

## 2019-03-04 NOTE — Therapy (Signed)
[place], [place], [address] Phone: [phone]   Fax:  [phone]  Physical Therapy Treatment  Patient Details  Name: Cindy Conner MRN: [id number] Date of Birth: 09/04/1955 Referring Provider (PT): Newman Pies MD   Encounter Date: 03/04/2019  PT End of Session - 03/04/19 1019    Visit Number  5    Number of Visits  12    Date for PT Re-Evaluation  03/26/19    Authorization Type  BCBS    PT Start Time  [id number]    PT Stop Time  1058    PT Time Calculation (min)  40 min       Past Medical History:  Diagnosis Date  . Headache(784.0)   . Perimenopausal vasomotor symptoms   . Postmenopausal   . Stress incontinence, female   . Tenesmus     Past Surgical History:  Procedure Laterality Date  . BREAST ENHANCEMENT SURGERY      There were no vitals filed for this visit.                    [facility] Adult PT Treatment/Exercise - 03/04/19 0001      Lumbar Exercises: Standing   Other Standing Lumbar Exercises  squat with 25#KB    Other Standing Lumbar Exercises  Deadlift 25# x 15 needs cues for alignment, 25# KB x 10 , cues for gluteal squeeze , on 6 in step Deadlift 30# x10 from floor,  then on 6 inch step #45 Deadlift x10, then x 10 from floor        Lumbar Exercises: Quadruped   Other Quadruped Lumbar Exercises  camel to childs pose x 10      Knee/Hip Exercises: Stretches   Active Hamstring Stretch Limitations  supine with strap     Other Knee/Hip Stretches  Review of seated for car ride during holiday, piriformis push/pull, hamstring    Other Knee/Hip Stretches  sciatic nerve glide supine and in standing. 10 x each.      Knee/Hip Exercises: Machines for Strengthening   Cybex Leg Press  supine leg press 3 plates ([id number])     Other Machine  Review of Lat pull, seated row, knee machine and cybex leg press - cues for form and set up provided                PT Short Term  Goals - 02/12/19 0933      PT SHORT TERM GOAL #1   Title  STG=LTG        PT Long Term Goals - 03/02/19 1048      PT LONG TERM GOAL #1   Title  Pt will be independent with advanced HEP.    Baseline  working on advanced back strengthening    Time  6    Period  Weeks    Status  On-going      PT LONG TERM GOAL #2   Title  Pt will tolerate sitting 1 hour without increased pain to ride in car without increased pain    Baseline  utilizing sciatic nerve glide but sciatica 50% better, can sit at desk longer    Time  6    Period  Weeks    Status  On-going      PT LONG TERM GOAL #3   Title  Pt will improve L/ hip/back extensor/flexor strength to >/= 4+/5 with no exacerbation of pain to promote strength with transitional movements from  car to standing no pain with transitional movements    Baseline  10 sec on eval   now 1 minute 20 sec    Time  6    Period  Weeks    Status  Achieved      PT LONG TERM GOAL #4   Title  Sit and stand with RT=LT wt bearing to reduce lumbar strain and allow for increased tolerance for these positions for work tasks    Baseline  able to bear symmetrically    Time  6    Period  Weeks    Status  Achieved            Plan - 03/04/19 1119    Clinical Impression Statement  Pt reports sitting is still the most botersome but she is unsure about long term driving because she has not needed to for prolonged periods. She will go out of town next weekend for [employer]. We reviewed stretches for her a passenger to attempt to prevent sciatica pain. Also progressed with dead lifting and squats with increased reps. She tolerated all well without increased pain.    PT Next Visit Plan  finalize HEP for home use, gym equipment and DN as needed and return to communtity wellness    PT Home Exercise Plan  sciatic nerve glide supine and standing hip hinge, deadlift       Patient will benefit from skilled therapeutic intervention in order to improve the following deficits  and impairments:  Decreased safety awareness  Visit Diagnosis: Left-sided low back pain with left-sided sciatica, unspecified chronicity  Muscle weakness (generalized)     Problem List Patient Active Problem List   Diagnosis Date Noted  . Other symptoms involving digestive system(787.99) 10/23/2010  . Tenesmus 10/22/2010  . IBS (irritable bowel syndrome) 10/22/2010    Dorene Ar, PTA 03/04/2019, 11:23 AM  [place], [place], [address] Phone: [phone]   Fax:  [phone]  Name: BARBARITA COONTZ MRN: [id number] Date of Birth: December 30, 1955

## 2019-03-09 ENCOUNTER — Encounter: Payer: Self-pay | Admitting: Physical Therapy

## 2019-03-09 ENCOUNTER — Other Ambulatory Visit: Payer: Self-pay

## 2019-03-09 ENCOUNTER — Ambulatory Visit: Payer: BC Managed Care – PPO | Admitting: Physical Therapy

## 2019-03-09 DIAGNOSIS — M5442 Lumbago with sciatica, left side: Secondary | ICD-10-CM | POA: Diagnosis not present

## 2019-03-09 DIAGNOSIS — M6281 Muscle weakness (generalized): Secondary | ICD-10-CM

## 2019-03-09 NOTE — Therapy (Signed)
[place], [place], [address] Phone: [phone]   Fax:  [phone]  Physical Therapy Treatment  Patient Details  Name: Cindy Conner MRN: [id number] Date of Birth: Oct 24, 1955 Referring Provider (PT): Newman Pies MD   Encounter Date: 03/09/2019  PT End of Session - 03/09/19 1028    Visit Number  6    Number of Visits  12    Date for PT Re-Evaluation  03/26/19    PT Start Time  1018    PT Stop Time  1113    PT Time Calculation (min)  55 min    Activity Tolerance  Patient tolerated treatment well    Behavior During Therapy  [facility] for tasks assessed/performed       Past Medical History:  Diagnosis Date  . Headache(784.0)   . Perimenopausal vasomotor symptoms   . Postmenopausal   . Stress incontinence, female   . Tenesmus     Past Surgical History:  Procedure Laterality Date  . BREAST ENHANCEMENT SURGERY      There were no vitals filed for this visit.  Subjective Assessment - 03/09/19 1025    Subjective  I was painting a bathroom and I had LT sciatica and then some RT lower back  ( SI) pain afterwards for a couple of days.  I am going on a trip for Thanksgiving    Pertinent History  left knee pain surgery, torn menicus Dr [facility], > 5 years ago    Currently in Pain?  Yes    Pain Score  3     Pain Location  Back    Pain Descriptors / Indicators  Aching    Pain Type  Chronic pain    Pain Onset  More than a month ago    Pain Frequency  Intermittent                       [facility] Adult PT Treatment/Exercise - 03/09/19 0001      Self-Care   Self-Care  Other Self-Care Comments    Other Self-Care Comments   driving and awareness of rotation of spine with movements  and painting.       Lumbar Exercises: Standing   Shoulder Adduction Limitations  full body hip flexor stretch with UE overhead      Lumbar Exercises: Supine   Other Supine Lumbar Exercises  dead bug with alternat  leg /UE isometiric with alternate leg UE resisted iwth green t band and then vice versa       Lumbar Exercises: Quadruped   Other Quadruped Lumbar Exercises  with green t band resisted bird dog 2 x 10 RT and then LT with VC to decrease rotation of back      Moist Heat Therapy   Number Minutes Moist Heat  15 Minutes    Moist Heat Location  Lumbar Spine   prone     Manual Therapy   Manual Therapy  Soft tissue mobilization    Manual therapy comments  skilled palpation for TPDN    Soft tissue mobilization  RT /LT paraspinal and QL and gluts       Trigger Point Dry Needling - 03/09/19 0001    Consent Given?  Yes    Education Handout Provided  Previously provided    Muscles Treated Back/Hip  Lumbar multifidi    Other Dry Needling  21mm    Lumbar multifidi Response  Twitch response elicited;Palpable increased muscle length  L-3 to S-1  RT          PT Education - 03/09/19 1056    Education Details  add to HEP for core strengthening    Person(s) Educated  Patient    Methods  Explanation;Demonstration;Tactile cues;Verbal cues;Handout    Comprehension  Verbalized understanding;Returned demonstration       PT Short Term Goals - 02/12/19 0933      PT SHORT TERM GOAL #1   Title  STG=LTG        PT Long Term Goals - 03/02/19 1048      PT LONG TERM GOAL #1   Title  Pt will be independent with advanced HEP.    Baseline  working on advanced back strengthening    Time  6    Period  Weeks    Status  On-going      PT LONG TERM GOAL #2   Title  Pt will tolerate sitting 1 hour without increased pain to ride in car without increased pain    Baseline  utilizing sciatic nerve glide but sciatica 50% better, can sit at desk longer    Time  6    Period  Weeks    Status  On-going      PT LONG TERM GOAL #3   Title  Pt will improve L/ hip/back extensor/flexor strength to >/= 4+/5 with no exacerbation of pain to promote strength with transitional movements from car to standing no pain  with transitional movements    Baseline  10 sec on eval   now 1 minute 20 sec    Time  6    Period  Weeks    Status  Achieved      PT LONG TERM GOAL #4   Title  Sit and stand with RT=LT wt bearing to reduce lumbar strain and allow for increased tolerance for these positions for work tasks    Baseline  able to bear symmetrically    Time  6    Period  Weeks    Status  Achieved            Plan - 03/09/19 1704    Clinical Impression Statement  Pt reports some pain while sitting still but she does feel stronger.  Pt still progressing with exeriicse.  More aware of body mechnanics this week  Will continue POC    Examination-Activity Limitations  Sit    Stability/Clinical Decision Making  Stable/Uncomplicated    Clinical Decision Making  Low    Rehab Potential  Good    PT Frequency  2x / week    PT Duration  6 weeks    PT Treatment/Interventions  Cryotherapy;Therapeutic activities;Electrical Stimulation;Iontophoresis 4mg /ml Dexamethasone;Moist Heat;Traction;Ultrasound;Neuromuscular re-education;Therapeutic exercise;Patient/family education;Manual techniques;Taping;Dry needling;Passive range of motion    PT Next Visit Plan  finalize HEP for home use, gym equipment and DN as needed and return to communtity wellness    PT Home Exercise Plan  sciatic nerve glide supine and standing hip hinge, deadlift    Consulted and Agree with Plan of Care  Patient       Patient will benefit from skilled therapeutic intervention in order to improve the following deficits and impairments:  Decreased safety awareness  Visit Diagnosis: Left-sided low back pain with left-sided sciatica, unspecified chronicity  Muscle weakness (generalized)     Problem List Patient Active Problem List   Diagnosis Date Noted  . Other symptoms involving digestive system(787.99) 10/23/2010  . Tenesmus 10/22/2010  . IBS (irritable bowel syndrome)  10/22/2010   Voncille Lo, PT Certified Exercise Expert for the  Aging Adult  03/09/19 5:06 PM Phone: [phone] Fax: [facility] [address] [place], [place], [address] Phone: [phone]   Fax:  [phone]  Name: Cindy Conner MRN: [id number] Date of Birth: 03/08/1956

## 2019-03-09 NOTE — Patient Instructions (Signed)
     Voncille Lo, PT Certified Exercise Expert for the Aging Adult  03/09/19 10:56 AM Phone: [phone] Fax: [phone]

## 2019-03-17 ENCOUNTER — Other Ambulatory Visit: Payer: Self-pay | Admitting: Obstetrics and Gynecology

## 2019-03-17 DIAGNOSIS — N63 Unspecified lump in unspecified breast: Secondary | ICD-10-CM

## 2019-03-19 ENCOUNTER — Ambulatory Visit: Payer: BC Managed Care – PPO | Attending: Neurosurgery | Admitting: Physical Therapy

## 2019-03-19 ENCOUNTER — Encounter: Payer: Self-pay | Admitting: Physical Therapy

## 2019-03-19 ENCOUNTER — Other Ambulatory Visit: Payer: Self-pay

## 2019-03-19 DIAGNOSIS — M5442 Lumbago with sciatica, left side: Secondary | ICD-10-CM

## 2019-03-19 DIAGNOSIS — M6281 Muscle weakness (generalized): Secondary | ICD-10-CM | POA: Diagnosis present

## 2019-03-19 NOTE — Therapy (Signed)
[place], [place], [address] Phone: [phone]   Fax:  [phone]  Physical Therapy Treatment  Patient Details  Name: Cindy Conner MRN: [id number] Date of Birth: [birth year] Referring Provider (PT): Newman Pies MD   Encounter Date: 03/19/2019  PT End of Session - 03/19/19 0857    Visit Number  7    Number of Visits  12    Date for PT Re-Evaluation  03/26/19    Authorization Type  BCBS    PT Start Time  0805    PT Stop Time  0850    PT Time Calculation (min)  45 min    Activity Tolerance  Patient tolerated treatment well    Behavior During Therapy  [facility] for tasks assessed/performed       Past Medical History:  Diagnosis Date  . Headache(784.0)   . Perimenopausal vasomotor symptoms   . Postmenopausal   . Stress incontinence, female   . Tenesmus     Past Surgical History:  Procedure Laterality Date  . BREAST ENHANCEMENT SURGERY      There were no vitals filed for this visit.  Subjective Assessment - 03/19/19 0809    Subjective  After 15 minutes, I still have LT side sciatica which is better than initial pain,  not as intense.   My RT side with no pain today.    Pertinent History  left knee pain surgery, torn menicus Dr [facility]emi [facility], > 5 years ago    How long can you sit comfortably?  15-20 min and gets up and walks around    How long can you stand comfortably?  dull after 1 hour    How long can you walk comfortably?  walk comfortably most of time    Diagnostic tests  no test this session    Patient Stated Goals  to stop nerve    Currently in Pain?  Yes    Pain Score  2     Pain Location  Back    Pain Orientation  Left    Pain Descriptors / Indicators  Tightness    Pain Type  Chronic pain    Pain Onset  More than a month ago    Pain Frequency  Intermittent   when sitting   Aggravating Factors   driving and sitting but I get up and move and I willnow you movement strategies          [facility] PT Assessment - 03/19/19 0001      Strength   Overall Strength Comments  Back ext off mat at waste maintained for 1 min 35 sec    Right Hip Flexion  5/5    Right Hip Extension  5/5    Right Hip ABduction  5/5    Left Hip Flexion  5/5    Left Hip Extension  4+/5    Left Hip ABduction  4+/5    Lumbar Flexion  5/5    Lumbar Extension  4+/5                   [facility] Adult PT Treatment/Exercise - 03/19/19 0001      Self-Care   Self-Care  Other Self-Care Comments    Other Self-Care Comments   pain management strategies for car and sitting to alleviate sciatica      Lumbar Exercises: Stretches   Active Hamstring Stretch  5 reps    Active Hamstring Stretch Limitations  deadlift Jefferson curl with 25 #  KB from 8 inch step to 4 inch step  VC and TC for technique    Passive Hamstring Stretch  3 reps;30 seconds    Passive Hamstring Stretch Limitations  standing with LT foot on stool    Lower Trunk Rotation  10 seconds;5 reps    Lower Trunk Rotation Limitations  RT and LT    Pelvic Tilt  10 reps    Press Ups  10 reps    Press Ups Limitations  no difference in pain 2/10 remains    Other Lumbar Stretch Exercise  Q ped thread the needle 10 x and thoracic q ped rotation with elbow toward ceiling.       Lumbar Exercises: Standing   Shoulder Adduction Limitations  standing hip abd LT with ball on wall 10 x 15-20 sec    Other Standing Lumbar Exercises  deadlift ladder KB , 10#, 25 # adn 30# 5 x each x 2 cycles    Other Standing Lumbar Exercises  hip hinge x 10 with 30 # deadlift      Lumbar Exercises: Supine   Bridge with [employer]  20 reps    Bridge with [employer] Limitations  x 2      Knee/Hip Exercises: Machines for Strengthening   Total Gym Leg Press  5 plates  [id number] lb 2 x 15       Manual Therapy   Manual Therapy  Joint mobilization    Joint Mobilization  LT long arm distraction 5 bouts of 30 sec, LT hip AP glide grade 4   decreased sciatica after RX             PT Education - 03/19/19 0856    Education Details  added to HEP for pain management strategy and strength    Person(s) Educated  Patient    Methods  Explanation;Demonstration;Tactile cues;Verbal cues;Handout    Comprehension  Verbalized understanding;Returned demonstration       PT Short Term Goals - 02/12/19 0933      PT SHORT TERM GOAL #1   Title  STG=LTG        PT Long Term Goals - 03/19/19 0907      PT LONG TERM GOAL #1   Title  Pt will be independent with advanced HEP. ( target 03-26-19)    Baseline  Pt now moving on to gym equipment and weights    Time  6    Period  Weeks    Status  On-going      PT LONG TERM GOAL #2   Title  Pt will tolerate sitting 1 hour without increased pain to ride in car without increased pain    Baseline  utilizing sciatic nerve glide but sciatica 50% better, 2/10 pain less intense than eval  walks around 20 minutes    Time  6    Period  Weeks    Status  On-going      PT LONG TERM GOAL #3   Title  Pt will improve L/ hip/back extensor/flexor strength to >/= 4+/5 with no exacerbation of pain to promote strength with transitional movements from car to standing no pain with transitional movements    Baseline  10 sec on eval   now 1 minute 35 sec    Time  6    Period  Weeks    Status  Achieved      PT LONG TERM GOAL #4   Title  Sit and stand with RT=LT wt bearing to reduce  lumbar strain and allow for increased tolerance for these positions for work tasks    Baseline  able to bear symmetrically    Time  6    Period  Weeks    Status  Achieved            Plan - 03/19/19 0909    Clinical Impression Statement  Pt is 2/10 LT sciatica and no pain on RT today.  Pt working on pain management strategies and continuing to strengthen.  Pt able to extend back on edge of plinth 1 min and 35 sec.  Good back extension strength.  Ms Stjulian is an excellent patient and motivated to continue stretngthining after DC with community wellness  suggestions. Pt now leg lift 105 pounds 2 x 15 reps  Continue fo 1-2 visits to finalize HEP and DC    Examination-Activity Limitations  Sit    Stability/Clinical Decision Making  Stable/Uncomplicated    Clinical Decision Making  Low    Rehab Potential  Good    PT Frequency  2x / week    PT Treatment/Interventions  Cryotherapy;Therapeutic activities;Electrical Stimulation;Iontophoresis 4mg /ml Dexamethasone;Moist Heat;Traction;Ultrasound;Neuromuscular re-education;Therapeutic exercise;Patient/family education;Manual techniques;Taping;Dry needling;Passive range of motion    PT Next Visit Plan  finalize HEP for home use, gym equipment and DN as needed and return to communtity wellness DC in 1-2 visits    PT Home Exercise Plan  sciatic nerve glide supine and standing hip hinge, deadlift    Consulted and Agree with Plan of Care  Patient       Patient will benefit from skilled therapeutic intervention in order to improve the following deficits and impairments:  Decreased safety awareness  Visit Diagnosis: Left-sided low back pain with left-sided sciatica, unspecified chronicity  Muscle weakness (generalized)     Problem List Patient Active Problem List   Diagnosis Date Noted  . Other symptoms involving digestive system(787.99) 10/23/2010  . Tenesmus 10/22/2010  . IBS (irritable bowel syndrome) 10/22/2010    Voncille Lo, PT Certified Exercise Expert for the Aging Adult  03/19/19 9:13 AM Phone: [phone] Fax: [place] [address] [place], [place], [address] Phone: [phone]   Fax:  [phone]  Name: Cindy Conner MRN: [id number] Date of Birth: March 06, 1956

## 2019-03-19 NOTE — Patient Instructions (Signed)
  For car ride, try these pain management techniques    Either resist quads on LT (pushing against) or resist hamstrings  After sitting for 15 minutes  Let me know which one or if they are effective      Voncille Lo, PT Certified Exercise Expert for the Aging Adult  03/19/19 8:50 AM Phone: [phone] Fax: [phone]

## 2019-03-23 ENCOUNTER — Ambulatory Visit: Payer: BC Managed Care – PPO | Admitting: Physical Therapy

## 2019-03-23 ENCOUNTER — Other Ambulatory Visit: Payer: Self-pay

## 2019-03-23 DIAGNOSIS — M5442 Lumbago with sciatica, left side: Secondary | ICD-10-CM | POA: Diagnosis not present

## 2019-03-23 DIAGNOSIS — M6281 Muscle weakness (generalized): Secondary | ICD-10-CM

## 2019-03-23 NOTE — Patient Instructions (Signed)
     Voncille Lo, PT Certified Exercise Expert for the Aging Adult  03/23/19 11:26 AM Phone: [phone] Fax: [phone]

## 2019-03-23 NOTE — Therapy (Signed)
[place], [place], [address] Phone: [phone]   Fax:  [phone]  Physical Therapy Treatment  Patient Details  Name: Cindy Conner MRN: [id number] Date of Birth: [birth year] Referring Provider (PT): Newman Pies MD   Encounter Date: 03/23/2019  PT End of Session - 03/23/19 1105    Visit Number  8    Number of Visits  12    Date for PT Re-Evaluation  03/26/19    Authorization Type  BCBS    PT Start Time  1104    PT Stop Time  1155    PT Time Calculation (min)  51 min    Activity Tolerance  Patient tolerated treatment well    Behavior During Therapy  [facility] for tasks assessed/performed       Past Medical History:  Diagnosis Date  . Headache(784.0)   . Perimenopausal vasomotor symptoms   . Postmenopausal   . Stress incontinence, female   . Tenesmus     Past Surgical History:  Procedure Laterality Date  . BREAST ENHANCEMENT SURGERY      There were no vitals filed for this visit.  Subjective Assessment - 03/23/19 1124    Subjective  I am having residual pain in my LT buttock/low back. I havent done any exericise but it is there after standing for 15 minutes.    Pertinent History  left knee pain surgery, torn menicus Dr [facility]emi [facility], > 5 years ago    Diagnostic tests  no test this session    Patient Stated Goals  to stop nerve    Currently in Pain?  Yes    Pain Score  2     Pain Location  Back    Pain Orientation  Left    Pain Descriptors / Indicators  Tightness;Sore    Pain Type  Chronic pain    Pain Radiating Towards  toward back of LT knee    Pain Onset  More than a month ago    Pain Frequency  Intermittent                       [facility] Adult PT Treatment/Exercise - 03/23/19 0001      Lumbar Exercises: Stretches   Lower Trunk Rotation  10 seconds;5 reps    Lower Trunk Rotation Limitations  RT and LT    Pelvic Tilt  10 reps    Other Lumbar Stretch Exercise  Q ped thread  the needle 10 x and thoracic q ped rotation with elbow toward ceiling.       Lumbar Exercises: Supine   Other Supine Lumbar Exercises  figure 4 prirformis stretch 30 sec on LT and then RT      Lumbar Exercises: Quadruped   Other Quadruped Lumbar Exercises  alternating pigeon stretch 10 times RT and then LT    Other Quadruped Lumbar Exercises  prone press up 15 x       Knee/Hip Exercises: Stretches   Other Knee/Hip Stretches  sciatic nerve glide supine and in standing. 10 x each.      Moist Heat Therapy   Number Minutes Moist Heat  15 Minutes    Moist Heat Location  Lumbar Spine   prone     Manual Therapy   Manual Therapy  Joint mobilization    Manual therapy comments  skilled palpation with TPDN    Joint Mobilization  LT long arm distraction 5 bouts of 30 sec, LT hip AP glide  grade 4   decreased sciatica after RX   Soft tissue mobilization  LT gluteal. lumbar region       Trigger Point Dry Needling - 03/23/19 0001    Consent Given?  Yes    Education Handout Provided  Previously provided    Muscles Treated Back/Hip  Lumbar multifidi;Gluteus maximus;Piriformis    Electrical Stimulation Performed with Dry Needling  Yes    E-stim with Dry Needling Details  TENS 25 pps with 30 gage 30 mm    Other Dry Needling  78mm    Gluteus Maximus Response  Twitch response elicited    Lumbar multifidi Response  Twitch response elicited;Palpable increased muscle length             PT Short Term Goals - 02/12/19 0933      PT SHORT TERM GOAL #1   Title  STG=LTG        PT Long Term Goals - 03/19/19 0907      PT LONG TERM GOAL #1   Title  Pt will be independent with advanced HEP. ( target 03-26-19)    Baseline  Pt now moving on to gym equipment and weights    Time  6    Period  Weeks    Status  On-going      PT LONG TERM GOAL #2   Title  Pt will tolerate sitting 1 hour without increased pain to ride in car without increased pain    Baseline  utilizing sciatic nerve glide but  sciatica 50% better, 2/10 pain less intense than eval  walks around 20 minutes    Time  6    Period  Weeks    Status  On-going      PT LONG TERM GOAL #3   Title  Pt will improve L/ hip/back extensor/flexor strength to >/= 4+/5 with no exacerbation of pain to promote strength with transitional movements from car to standing no pain with transitional movements    Baseline  10 sec on eval   now 1 minute 35 sec    Time  6    Period  Weeks    Status  Achieved      PT LONG TERM GOAL #4   Title  Sit and stand with RT=LT wt bearing to reduce lumbar strain and allow for increased tolerance for these positions for work tasks    Baseline  able to bear symmetrically    Time  6    Period  Weeks    Status  Achieved            Plan - 03/23/19 1147    Clinical Impression Statement  Pt with 2/10 sciatica on LT.  Pt consented to TPDN and estim concurrently.  Pt given additional HEP exericses and reviewed some for sciatica.  Pt was able to handle the new exericises with ease and is working towards goal completion and DC    Examination-Activity Limitations  Sit    Stability/Clinical Decision Making  Stable/Uncomplicated    Clinical Decision Making  Low    Rehab Potential  Good    PT Frequency  2x / week    PT Treatment/Interventions  Cryotherapy;Therapeutic activities;Electrical Stimulation;Iontophoresis 4mg /ml Dexamethasone;Moist Heat;Traction;Ultrasound;Neuromuscular re-education;Therapeutic exercise;Patient/family education;Manual techniques;Taping;Dry needling;Passive range of motion    PT Next Visit Plan  finalize HEP for home use, gym equipment and DN as needed and return to communtity wellness DC in 1-2 visits    PT Home Exercise Plan  sciatic nerve glide supine and  standing hip hinge, deadlift, pigeon, prone press up and supine figure 4    Consulted and Agree with Plan of Care  Patient       Patient will benefit from skilled therapeutic intervention in order to improve the following  deficits and impairments:  Decreased safety awareness  Visit Diagnosis: Left-sided low back pain with left-sided sciatica, unspecified chronicity  Muscle weakness (generalized)     Problem List Patient Active Problem List   Diagnosis Date Noted  . Other symptoms involving digestive system(787.99) 10/23/2010  . Tenesmus 10/22/2010  . IBS (irritable bowel syndrome) 10/22/2010    Voncille Lo, PT Certified Exercise Expert for the Aging Adult  03/23/19 1:10 PM Phone: [phone] Fax: [facility] [address] [place], [place], [address] Phone: [phone]   Fax:  [phone]  Name: Cindy Conner MRN: [id number] Date of Birth: [birth year]

## 2019-03-25 ENCOUNTER — Encounter: Payer: Self-pay | Admitting: Physical Therapy

## 2019-03-25 ENCOUNTER — Ambulatory Visit: Payer: BC Managed Care – PPO | Admitting: Physical Therapy

## 2019-03-25 ENCOUNTER — Other Ambulatory Visit: Payer: Self-pay

## 2019-03-25 DIAGNOSIS — M5442 Lumbago with sciatica, left side: Secondary | ICD-10-CM | POA: Diagnosis not present

## 2019-03-25 DIAGNOSIS — M6281 Muscle weakness (generalized): Secondary | ICD-10-CM

## 2019-03-25 NOTE — Patient Instructions (Signed)

## 2019-03-25 NOTE — Therapy (Signed)
Cindy Conner, [place], [address] Phone: [phone]   Fax:  [phone]  Physical Therapy Treatment/Discharge Note  Patient Details  Name: Cindy Conner MRN: [id number] Date of Birth: 1955-09-16 Referring Provider (PT): Cindy Pies MD   Encounter Date: 03/25/2019  PT End of Session - 03/25/19 1112    Visit Number  9    Number of Visits  12    Date for PT Re-Evaluation  03/26/19    Authorization Type  BCBS    PT Start Time  1103    PT Stop Time  1200    PT Time Calculation (min)  57 min    Activity Tolerance  Patient tolerated treatment well    Behavior During Therapy  [facility] for tasks assessed/performed       Past Medical History:  Diagnosis Date  . Headache(784.0)   . Perimenopausal vasomotor symptoms   . Postmenopausal   . Stress incontinence, female   . Tenesmus     Past Surgical History:  Procedure Laterality Date  . BREAST ENHANCEMENT SURGERY      There were no vitals filed for this visit.  Subjective Assessment - 03/25/19 1107    Subjective  I really dont have pain until I drive.  I rode to [facility] for 45 min but coming back I had pain after 20 minutes    Pertinent History  left knee pain surgery, torn menicus Dr [facility], > 5 years ago    Limitations  Sitting;Standing    How long can you sit comfortably?  was able to sit for 45 min to drive to [place]    How long can you stand comfortably?  dull after 1 hour    How long can you walk comfortably?  walk comfortably most of time    Diagnostic tests  no test this session    Patient Stated Goals  to stop nerve    Currently in Pain?  No/denies    Pain Score  0-No pain    Pain Location  Back    Pain Orientation  Left         [facility] PT Assessment - 03/25/19 0001      AROM   Overall AROM   Deficits    Lumbar Flexion  90% available Range   able to touch the floor no pain   Lumbar Extension  90% available range    Lumbar - Right  Side Bend  19 inches from ffinger tip to floor    Lumbar - Left Side Bend  21 inches from floor    Lumbar - Right Rotation  95% available range    Lumbar - Left Rotation  95 % available range      Strength   Overall Strength Comments  Back ext off mat at waste maintained for 1 min 35 sec    Right Hip Flexion  5/5    Right Hip Extension  5/5    Right Hip ABduction  5/5    Left Hip Flexion  5/5    Left Hip Extension  5/5    Left Hip ABduction  4+/5    Lumbar Flexion  5/5    Lumbar Extension  4+/5                   [facility] Adult PT Treatment/Exercise - 03/25/19 0001      Self-Care   Self-Care  ADL's;Lifting;Posture    ADL's  went over handout and pain management ideas  with posture    Lifting  PT demo lifting/ reinforced hip hinge movements    Posture  worked on posture with job and     Other Self-Care Comments   use of lumbar roll for driving and community wellness ideas, review  HEP questions       Knee/Hip Exercises: Stretches   Other Knee/Hip Stretches  sciatic nerve glide supine and in standing. 10 x each.      Moist Heat Therapy   Number Minutes Moist Heat  12 Minutes    Moist Heat Location  Lumbar Spine   prone     Manual Therapy   Manual Therapy  Joint mobilization    Manual therapy comments  skilled palpation with TPDN    Joint Mobilization  LT long arm distraction 5 bouts of 30 sec, LT hip AP glide grade 4   decreased sciatica after RX   Soft tissue mobilization  LT gluteal. lumbar region       Trigger Point Dry Needling - 03/25/19 0001    Consent Given?  Yes    Education Handout Provided  Previously provided    Muscles Treated Back/Hip  Lumbar multifidi;Gluteus maximus;Piriformis    Electrical Stimulation Performed with Dry Needling  Yes    E-stim with Dry Needling Details  TENS 25 pps with 30 gage 30 mm    Gluteus Maximus Response  Twitch response elicited    Piriformis Response  Twitch response elicited;Palpable increased muscle length    Lumbar  multifidi Response  Twitch response elicited   LT L-5 to s 1   Quadratus Lumborum Response  Palpable increased muscle length           PT Education - 03/25/19 1126    Education Details  added posture /body mechanics to HEP, reviewed HEP and community opportunities    Person(s) Educated  Patient    Methods  Explanation;Demonstration;Handout    Comprehension  Verbalized understanding;Returned demonstration       PT Short Term Goals - 02/12/19 0933      PT SHORT TERM GOAL #1   Title  STG=LTG        PT Long Term Goals - 03/25/19 1148      PT LONG TERM GOAL #1   Title  Pt will be independent with advanced HEP. ( target 03-26-19)    Baseline  Pt now moving on to gym equipment and weights and community wellness    Time  6    Period  Weeks    Status  Achieved    Target Date  03/26/19      PT LONG TERM GOAL #2   Title  Pt will tolerate sitting 1 hour without increased pain to ride in car without increased pain    Baseline  Pt with no pain today in clinic able to drive to [place] 45 minutes this week and will benefit from strengthening and lumbar roll for long trips    Time  6    Period  Weeks    Status  Partially Met    Target Date  03/26/19      PT LONG TERM GOAL #3   Title  Pt will improve L/ hip/back extensor/flexor strength to >/= 4+/5 with no exacerbation of pain to promote strength with transitional movements from car to standing no pain with transitional movements    Baseline  back extension maintaining position off matt 1 min 35 sec   4+/5  to 5/5 MMT LE    Time  6    Period  Weeks    Status  Achieved    Target Date  03/26/19      PT LONG TERM GOAL #4   Title  Sit and stand with RT=LT wt bearing to reduce lumbar strain and allow for increased tolerance for these positions for work tasks    Baseline  able to bear symmetrically    Time  6    Period  Weeks    Status  Achieved            Plan - 03/25/19 1258    Clinical Impression Statement  Pt with no  pain this morninig.  Pt educated/reviewed HEP, posture, ADL, lifting and pain management strategies including utilizing a lumbar roll for driving.  Pt FOTO is 6% limitation ( 12 % predicted)  All goals met or partially met.  Pt still has LT low back sensitivity but is able to utilize exericise and has been directed to further strenghthen at community level.  Cindy Conner was a joy for whom to serve and she should continue to do well and be active in the community for a long time    Examination-Activity Limitations  Sit    Stability/Clinical Decision Making  Stable/Uncomplicated    Clinical Decision Making  Low    Rehab Potential  Good    PT Frequency  2x / week    PT Duration  6 weeks    PT Treatment/Interventions  Cryotherapy;Therapeutic activities;Electrical Stimulation;Iontophoresis 2m/ml Dexamethasone;Moist Heat;Traction;Ultrasound;Neuromuscular re-education;Therapeutic exercise;Patient/family education;Manual techniques;Taping;Dry needling;Passive range of motion    PT Next Visit Plan  DC    PT Home Exercise Plan  sciatic nerve glide supine and standing hip hinge, deadlift, pigeon, prone press up and supine figure 4 See HEP    Consulted and Agree with Plan of Care  Patient       Patient will benefit from skilled therapeutic intervention in order to improve the following deficits and impairments:  Decreased safety awareness  Visit Diagnosis: Left-sided low back pain with left-sided sciatica, unspecified chronicity  Muscle weakness (generalized)     Problem List Patient Active Problem List   Diagnosis Date Noted  . Other symptoms involving digestive system(787.99) 10/23/2010  . Tenesmus 10/22/2010  . IBS (irritable bowel syndrome) 10/22/2010    LVoncille Lo PT Certified Exercise Expert for the Aging Adult  03/25/19 1:03 PM Phone: [phone]: [facility] [address]Phone:  [phone]  Fax:  [phone]   Name: Cindy WINBORNEMRN: [id number] of Birth: 8Aug 29, 1957    PHYSICAL THERAPY DISCHARGE SUMMARY  Visits from Start of Care: 9  Current functional level related to goals / functional outcomes: As above   Remaining deficits: Pt with occassional LT side pain but can use movement and lumbar roll to drive in car greater than 45 minutes   Education / Equipment: HEP and community wellness Plan: Patient agrees to discharge.  Patient goals were met. Patient is being discharged due to meeting the stated rehab goals.  ?????    And is pleased with current functional level  LVoncille Lo PT Certified Exercise Expert for the Aging Adult  03/25/19 1:04 PM Phone: [phone]: [phone]

## 2019-03-26 ENCOUNTER — Ambulatory Visit
Admission: RE | Admit: 2019-03-26 | Discharge: 2019-03-26 | Disposition: A | Discharge status: Home / Self Care | Payer: BC Managed Care – PPO | Source: Ambulatory Visit | Attending: Obstetrics and Gynecology | Admitting: Obstetrics and Gynecology

## 2019-03-26 ENCOUNTER — Other Ambulatory Visit: Payer: Self-pay | Admitting: Obstetrics and Gynecology

## 2019-03-26 DIAGNOSIS — N63 Unspecified lump in unspecified breast: Secondary | ICD-10-CM

## 2019-04-26 ENCOUNTER — Other Ambulatory Visit: Payer: Self-pay | Admitting: Obstetrics and Gynecology

## 2019-04-26 DIAGNOSIS — T8549XA Other mechanical complication of breast prosthesis and implant, initial encounter: Secondary | ICD-10-CM

## 2019-05-20 ENCOUNTER — Other Ambulatory Visit: Payer: Self-pay

## 2019-05-20 ENCOUNTER — Ambulatory Visit
Admission: RE | Admit: 2019-05-20 | Discharge: 2019-05-20 | Disposition: A | Discharge status: Home / Self Care | Payer: BC Managed Care – PPO | Source: Ambulatory Visit | Attending: Obstetrics and Gynecology | Admitting: Obstetrics and Gynecology

## 2019-05-20 DIAGNOSIS — T8549XA Other mechanical complication of breast prosthesis and implant, initial encounter: Secondary | ICD-10-CM

## 2020-03-21 ENCOUNTER — Other Ambulatory Visit: Payer: Self-pay | Admitting: Obstetrics and Gynecology

## 2020-03-21 DIAGNOSIS — Z1231 Encounter for screening mammogram for malignant neoplasm of breast: Secondary | ICD-10-CM

## 2020-03-22 ENCOUNTER — Other Ambulatory Visit: Payer: Self-pay | Admitting: Obstetrics and Gynecology

## 2020-03-22 DIAGNOSIS — N6310 Unspecified lump in the right breast, unspecified quadrant: Secondary | ICD-10-CM

## 2020-05-03 ENCOUNTER — Ambulatory Visit
Admission: RE | Admit: 2020-05-03 | Discharge: 2020-05-03 | Disposition: A | Discharge status: Home / Self Care | Payer: BC Managed Care – PPO | Source: Ambulatory Visit | Attending: Obstetrics and Gynecology | Admitting: Obstetrics and Gynecology

## 2020-05-03 ENCOUNTER — Other Ambulatory Visit: Payer: Self-pay

## 2020-05-03 DIAGNOSIS — Z1231 Encounter for screening mammogram for malignant neoplasm of breast: Secondary | ICD-10-CM

## 2020-05-22 ENCOUNTER — Other Ambulatory Visit: Payer: Self-pay

## 2020-05-22 ENCOUNTER — Ambulatory Visit
Admission: RE | Admit: 2020-05-22 | Discharge: 2020-05-22 | Disposition: A | Discharge status: Home / Self Care | Payer: BC Managed Care – PPO | Source: Ambulatory Visit | Attending: Obstetrics and Gynecology | Admitting: Obstetrics and Gynecology

## 2020-05-22 DIAGNOSIS — N6310 Unspecified lump in the right breast, unspecified quadrant: Secondary | ICD-10-CM

## 2020-05-22 MED ORDER — GADOBUTROL 1 MMOL/ML IV SOLN
5.0000 mL | Freq: Once | INTRAVENOUS | Status: AC | PRN
  Administered 2020-05-22: 5 mL via INTRAVENOUS

## 2020-06-01 ENCOUNTER — Other Ambulatory Visit: Payer: Self-pay | Admitting: Obstetrics and Gynecology

## 2020-06-01 DIAGNOSIS — R9389 Abnormal findings on diagnostic imaging of other specified body structures: Secondary | ICD-10-CM

## 2020-06-09 ENCOUNTER — Encounter: Payer: Self-pay | Admitting: Plastic Surgery

## 2020-06-09 ENCOUNTER — Ambulatory Visit (INDEPENDENT_AMBULATORY_CARE_PROVIDER_SITE_OTHER): Payer: BC Managed Care – PPO | Admitting: Plastic Surgery

## 2020-06-09 ENCOUNTER — Other Ambulatory Visit: Payer: Self-pay

## 2020-06-09 DIAGNOSIS — L989 Disorder of the skin and subcutaneous tissue, unspecified: Secondary | ICD-10-CM

## 2020-06-09 NOTE — Progress Notes (Signed)
Patient ID: Cindy Conner, female    DOB: Oct 21, 1955, 65 y.o.   MRN: [id number]   Chief Complaint  Patient presents with  . Skin Problem    The patient is a 65 year old female here for evaluation of her skin.  She was seen by dermatology and referred.  She has a history of skin cancer and Mohs surgery.  She is not a smoker and does not have diabetes.  She is 125 pounds.  She has 2 skin lesions on the right cheek.  Wound has a little scab on it and the other appears to be a deep pore.  I do not see any other concerning lesions.  She has some facial wrinkling and some skin laxity.  She is otherwise in good health.  She has had filler in the past and an upper and lower lid blepharoplasties in [place].   Review of Systems  Constitutional: Negative for activity change and appetite change.  Eyes: Negative.   Respiratory: Negative.  Negative for chest tightness and shortness of breath.   Cardiovascular: Negative.   Gastrointestinal: Negative.   Endocrine: Negative.   Genitourinary: Negative.   Musculoskeletal: Negative.   Skin: Positive for wound. Negative for rash.  Hematological: Negative.   Psychiatric/Behavioral: Negative.     Past Medical History:  Diagnosis Date  . Headache(784.0)   . Perimenopausal vasomotor symptoms   . Postmenopausal   . Stress incontinence, female   . Tenesmus     Past Surgical History:  Procedure Laterality Date  . AUGMENTATION MAMMAPLASTY Bilateral   . BREAST ENHANCEMENT SURGERY        Current Outpatient Medications:  .  ALPRAZolam (XANAX) 0.25 MG tablet, , Disp: , Rfl:  .  cycloSPORINE (RESTASIS) 0.05 % ophthalmic emulsion, Place 1 drop into both eyes 2 (two) times daily.   (Patient not taking: Reported on 06/09/2020), Disp: , Rfl:  .  peg 3350 powder (MOVIPREP) 100 G SOLR, Take 1 kit (100 g total) by mouth once. (Patient not taking: Reported on 06/09/2020), Disp: 1 kit, Rfl: 0 .  tretinoin (RETIN-A) 0.05 % cream, , Disp: , Rfl:   Current  Facility-Administered Medications:  .  0.9 %  sodium chloride infusion, 500 mL, Intravenous, Continuous, Sable Feil, MD   Objective:   Vitals:   06/09/20 1151  BP: 114/69  Pulse: 60  Temp: (!) 97.1 F (36.2 C)  SpO2: 98%    Physical Exam Vitals and nursing note reviewed.  Constitutional:      Appearance: Normal appearance.  HENT:     Head: Normocephalic and atraumatic.  Pulmonary:     Effort: Pulmonary effort is normal.  Musculoskeletal:        General: No swelling, tenderness, deformity or signs of injury.  Skin:    General: Skin is warm.     Capillary Refill: Capillary refill takes less than 2 seconds.     Coloration: Skin is not jaundiced.     Findings: No bruising.  Neurological:     General: No focal deficit present.     Mental Status: She is alert and oriented to person, place, and time.  Psychiatric:        Mood and Affect: Mood normal.        Behavior: Behavior normal.        Thought Content: Thought content normal.     Assessment & Plan:  Skin lesion  We discussed options for excision of one or both of the lesions that she  is concerned about.  She could also do laser treatment that may help.  She would like to wait and see if the lesions improve the appearance before jumping to excision.  I think that that is a good idea.  We will have her talk with Maudie Mercury upfront and see if we can get her scheduled in April.  Pictures were obtained of the patient and placed in the chart with the patient's or guardian's permission.   [place], DO

## 2020-06-19 ENCOUNTER — Other Ambulatory Visit: Payer: Self-pay

## 2020-06-19 ENCOUNTER — Ambulatory Visit
Admission: RE | Admit: 2020-06-19 | Discharge: 2020-06-19 | Disposition: A | Discharge status: Home / Self Care | Payer: Self-pay | Source: Ambulatory Visit | Attending: Obstetrics and Gynecology | Admitting: Obstetrics and Gynecology

## 2020-06-19 DIAGNOSIS — R9389 Abnormal findings on diagnostic imaging of other specified body structures: Secondary | ICD-10-CM

## 2020-08-01 ENCOUNTER — Encounter: Payer: Self-pay | Admitting: Plastic Surgery

## 2020-08-01 ENCOUNTER — Other Ambulatory Visit: Payer: Self-pay

## 2020-08-01 ENCOUNTER — Ambulatory Visit (INDEPENDENT_AMBULATORY_CARE_PROVIDER_SITE_OTHER): Payer: Self-pay | Admitting: Plastic Surgery

## 2020-08-01 DIAGNOSIS — Z719 Counseling, unspecified: Secondary | ICD-10-CM

## 2020-08-29 ENCOUNTER — Other Ambulatory Visit: Payer: Self-pay

## 2020-08-29 ENCOUNTER — Encounter: Payer: Self-pay | Admitting: Plastic Surgery

## 2020-08-29 ENCOUNTER — Ambulatory Visit (INDEPENDENT_AMBULATORY_CARE_PROVIDER_SITE_OTHER): Payer: BC Managed Care – PPO | Admitting: Plastic Surgery

## 2020-08-29 ENCOUNTER — Other Ambulatory Visit (HOSPITAL_COMMUNITY)
Admission: RE | Admit: 2020-08-29 | Discharge: 2020-08-29 | Disposition: A | Discharge status: Home / Self Care | Payer: BC Managed Care – PPO | Source: Ambulatory Visit | Attending: Plastic Surgery | Admitting: Plastic Surgery

## 2020-08-29 VITALS — BP 153/68 | HR 67

## 2020-08-29 DIAGNOSIS — L989 Disorder of the skin and subcutaneous tissue, unspecified: Secondary | ICD-10-CM | POA: Insufficient documentation

## 2020-08-29 NOTE — Progress Notes (Addendum)
Procedure Note  Preoperative Dx: changing skin lesion of right cheek  Postoperative Dx: Same  Procedure: excision of changing skin lesion of right face 3 mm  Anesthesia: Lidocaine 1% with 1:100,000 epinepherine  Indication for Procedure: skin lesion  Description of Procedure: Risks and complications were explained to the patient.  Consent was confirmed and the patient understands the risks and benefits.  The potential complications and alternatives were explained and the patient consents.  The patient expressed understanding the option of not having the procedure and the risks of a scar.  Time out was called and all information was confirmed to be correct.    The area was prepped and drapped.  Lidocaine 1% with epinepherine was injected in the subcutaneous area.  After waiting several minutes for the local to take affect a circular blade was used to excise the area.  The skin edges were reapproximated with 6-0 Monocryl subcuticular running closure.  A dressing was applied.  The patient was given instructions on how to care for the area and a follow up appointment.  Cindy Conner tolerated the procedure well and there were no complications. The specimen was sent to pathology.

## 2020-09-04 ENCOUNTER — Telehealth: Payer: Self-pay

## 2020-09-04 LAB — SURGICAL PATHOLOGY

## 2020-09-04 NOTE — Telephone Encounter (Signed)
Call to pt- no answer/left voicemail requesting call back

## 2020-09-06 ENCOUNTER — Telehealth: Payer: Self-pay

## 2020-09-06 NOTE — Telephone Encounter (Signed)
Pt returned phone call & left message that she understands the pathology report & has no further concerns at this time She has a f/u with [place], [place] on 09/08/20

## 2020-09-08 ENCOUNTER — Other Ambulatory Visit: Payer: Self-pay

## 2020-09-08 ENCOUNTER — Encounter: Payer: Self-pay | Admitting: Surgical

## 2020-09-08 ENCOUNTER — Ambulatory Visit (INDEPENDENT_AMBULATORY_CARE_PROVIDER_SITE_OTHER): Payer: BC Managed Care – PPO | Admitting: Surgical

## 2020-09-08 DIAGNOSIS — L989 Disorder of the skin and subcutaneous tissue, unspecified: Secondary | ICD-10-CM

## 2020-09-08 NOTE — Progress Notes (Signed)
Patient is a 65 year old female here for follow-up after excision of right cheek skin lesion on 08/29/2020 with Dr. Marla Roe.  Biopsy showed dilated pore of [place].  Patient reports that she is overall doing well.  She recently placed new Steri-Strips over the incision.  Everything is healing very nicely.  On exam the right cheek incision is intact.  There is a Monocryl suture still present.  There is no surrounding cellulitic changes.  No bruising or significant swelling noted.  No subcutaneous fluid collections noted.  Her facial function is normal.  Suture knots were removed.  Patient tolerated this fine.  New Steri-Strip was placed.  Recommend leaving this in place for a few days and then she can begin using scar cream.  Recommend following up on an as-needed basis.  Recommend calling with questions or concerns.  There is no sign of infection, seroma, hematoma

## 2020-10-02 ENCOUNTER — Telehealth: Payer: Self-pay

## 2020-10-02 NOTE — Telephone Encounter (Signed)
Patient called to say when we took her stitches out, she had two bumps, one above and one below the stitch line.  Patient said that one went away and the other one is still there.  She said that the bump is about the size of a BB.  She would like to know what it is and is wondering if a stitch could have been left in there.  Please call.

## 2020-10-02 NOTE — Telephone Encounter (Signed)
Returned patients call. Advised the sutures are dissolvable and should disappear within time. Denies any fever, chills, nausea, vomiting, opening of incision, drainage, nor odor. If she continues to feel the bump in a couple of weeks to call our office. Patient understood and agreed with plan.

## 2020-10-20 DIAGNOSIS — Z719 Counseling, unspecified: Secondary | ICD-10-CM | POA: Insufficient documentation

## 2020-10-20 NOTE — Progress Notes (Signed)
   Subjective:    Patient ID: Cindy Conner, female    DOB: 1955-12-22, 65 y.o.   MRN: [id number]  The patient is a 65 yrs old wf here for evaluation of her breasts.  Implant placement in 2015 and 2016.  She is 5 feet 9 inches tall and weighs 120 pounds she had a mammogram done in December of last year which showed dystrophic calcifications in the right breast that were consistent with fat necrosis.  It was not clear whether the implants were intact or ruptured but they do appear to silicone implants.  She had a MRI in February which showed fingerlike projections of silicone off the inferior lateral aspect of the left breast.  Its not clear if it is a leak or not.  The plan was to repeat the MRI in a year.  The patient's history is positive for osteopenia and irritable bowel disease.       Review of Systems  Constitutional: Negative.   HENT: Negative.    Eyes: Negative.   Respiratory: Negative.    Cardiovascular: Negative.   Gastrointestinal: Negative.   Endocrine: Negative.   Genitourinary: Negative.   Neurological: Negative.   Hematological: Negative.   Psychiatric/Behavioral: Negative.        Objective:   Physical Exam Vitals and nursing note reviewed.  Constitutional:      Appearance: Normal appearance.  HENT:     Head: Normocephalic and atraumatic.  Cardiovascular:     Rate and Rhythm: Normal rate.     Pulses: Normal pulses.  Pulmonary:     Effort: Pulmonary effort is normal.  Abdominal:     General: Abdomen is flat. There is no distension.  Skin:    General: Skin is warm.     Capillary Refill: Capillary refill takes less than 2 seconds.     Coloration: Skin is not jaundiced.     Findings: No bruising.  Neurological:     General: No focal deficit present.     Mental Status: She is alert.  Psychiatric:        Mood and Affect: Mood normal.        Behavior: Behavior normal.        Thought Content: Thought content normal.       Assessment & Plan:     ICD-10-CM    1. Encounter for counseling  Z71.9       I have reviewed the mammogram report from February 2022 which showed the inferior lateral corner of the left breast slightly increased in size with radial folds.  The MRI was done 06/23/2020 - no evidence of malignancy in either breast.  Patient is going to think about her options.  Likely repeat films.   Pictures were obtained of the patient and placed in the chart with the patient's or guardian's permission.

## 2020-11-13 ENCOUNTER — Encounter: Payer: Self-pay | Admitting: Gastroenterology

## 2021-01-03 ENCOUNTER — Other Ambulatory Visit: Payer: Self-pay

## 2021-01-03 ENCOUNTER — Ambulatory Visit (AMBULATORY_SURGERY_CENTER): Payer: BC Managed Care – PPO

## 2021-01-03 VITALS — Ht 69.0 in | Wt 120.0 lb

## 2021-01-03 DIAGNOSIS — Z1211 Encounter for screening for malignant neoplasm of colon: Secondary | ICD-10-CM

## 2021-01-03 NOTE — Progress Notes (Signed)
Pre visit completed via phone call; Patient verified name, DOB, and address; No egg or soy allergy known to patient  No issues known to pt with past sedation with any surgeries or procedures Patient denies ever being told they had issues or difficulty with intubation  No FH of Malignant Hyperthermia Pt is not on diet pills Pt is not on  home 02  Pt is not on blood thinners  Pt denies issues with constipation  No A fib or A flutter  EMMI video via MyChart  COVID 19 guidelines implemented in [facility] today with Pt and RN  Pt is fully vaccinated for Covid x 2; NO PA's for preps discussed with pt in [facility] today  Discussed with pt there will be an out-of-pocket cost for prep and that varies from $0 to 70 +  dollars  Due to the COVID-19 pandemic we are asking patients to follow certain guidelines.  Pt aware of COVID protocols and [facility] guidelines

## 2021-01-05 ENCOUNTER — Encounter: Payer: Self-pay | Admitting: Gastroenterology

## 2021-01-17 ENCOUNTER — Other Ambulatory Visit: Payer: Self-pay

## 2021-01-17 ENCOUNTER — Ambulatory Visit (AMBULATORY_SURGERY_CENTER): Payer: BC Managed Care – PPO | Admitting: Gastroenterology

## 2021-01-17 ENCOUNTER — Encounter: Payer: Self-pay | Admitting: Gastroenterology

## 2021-01-17 VITALS — BP 121/67 | HR 64 | Temp 97.8°F | Resp 12 | Ht 69.0 in | Wt 120.0 lb

## 2021-01-17 DIAGNOSIS — Z1211 Encounter for screening for malignant neoplasm of colon: Secondary | ICD-10-CM

## 2021-01-17 DIAGNOSIS — K635 Polyp of colon: Secondary | ICD-10-CM

## 2021-01-17 DIAGNOSIS — D120 Benign neoplasm of cecum: Secondary | ICD-10-CM

## 2021-01-17 MED ORDER — SODIUM CHLORIDE 0.9 % IV SOLN: 500.0000 mL | Freq: Once | INTRAVENOUS | Status: DC

## 2021-01-17 NOTE — Progress Notes (Signed)
Pt's states no medical or surgical changes since previsit or office visit.   Check-in-AER  V/S-SB

## 2021-01-17 NOTE — Progress Notes (Signed)
A and O x3. Report to RN. Tolerated MAC anesthesia well. 

## 2021-01-17 NOTE — Progress Notes (Signed)
   Referring Provider: Chesley Noon, MD Primary Care Physician:  Chesley Noon, MD  Reason for Procedure:  Colon cancer screening   IMPRESSION:  Need for colon cancer surveillance Normal colonoscopy 2003 and 2012 Appropriate candidate for monitored anesthesia care  PLAN: Colonoscopy in the [facility] today   HPI: Cindy Conner is a 65 y.o. female presents for surveillance colonoscopy.  Normal colonoscopy with Dr. Sharlett Iles 05/04/2001 for hemoccult positive stools.  Normal colonoscopy with Dr. Sharlett Iles 10/23/2010 for normal  No baseline GI symptoms.   Maternal first cousin with colon cancer at a young age. No other  known family history of colon cancer or polyps.    Past Medical History:  Diagnosis Date   Arthritis    generalized   Headache(784.0)    Osteopenia    Perimenopausal vasomotor symptoms    Postmenopausal    Stress incontinence, female    Tenesmus    Thyroid disease    on meds    Past Surgical History:  Procedure Laterality Date   AUGMENTATION MAMMAPLASTY Bilateral    MENISCUS REPAIR Left 2016    Current Outpatient Medications  Medication Sig Dispense Refill   ALPRAZolam (XANAX) 0.25 MG tablet Take 0.25 mg by mouth at bedtime as needed.     BIOTIN PO Take 1 tablet by mouth daily at 6 (six) AM.     levothyroxine (SYNTHROID) 25 MCG tablet Take 25 mcg by mouth daily.     MULTIPLE VITAMINS-CALCIUM PO Take 2 tablets by mouth daily at 6 (six) AM.     Multiple Vitamins-Minerals (CENTRUM SILVER 50+WOMEN PO) Take 1 tablet by mouth daily at 6 (six) AM.     NIACIN PO Take 500 mg by mouth daily at 6 (six) AM.     tretinoin (RETIN-A) 0.05 % cream Apply topically at bedtime.     TURMERIC PO Take 1 tablet by mouth daily at 6 (six) AM.     Current Facility-Administered Medications  Medication Dose Route Frequency Provider Last Rate Last Admin   0.9 %  sodium chloride infusion  500 mL Intravenous Continuous Sable Feil, MD        Allergies as of  01/17/2021   (No Known Allergies)    Family History  Problem Relation Age of Onset   Hypertension Father    Hyperlipidemia Father    Colon cancer Cousin    Colon polyps Neg Hx    Esophageal cancer Neg Hx    Rectal cancer Neg Hx    Stomach cancer Neg Hx      Physical Exam: General:   Alert,  well-nourished, pleasant and cooperative in NAD Head:  Normocephalic and atraumatic. Eyes:  Sclera clear, no icterus.   Conjunctiva pink. Mouth:  No deformity or lesions.   Neck:  Supple; no masses or thyromegaly. Lungs:  Clear throughout to auscultation.   No wheezes. Heart:  Regular rate and rhythm; no murmurs. Abdomen:  Soft, non-tender, nondistended, normal bowel sounds, no rebound or guarding.  Msk:  Symmetrical. No boney deformities LAD: No inguinal or umbilical LAD Extremities:  No clubbing or edema. Neurologic:  Alert and  oriented x4;  grossly nonfocal Skin:  No obvious rash or bruise. Psych:  Alert and cooperative. Normal mood and affect.    Warren Kugelman L. Tarri Glenn, MD, MPH 01/17/2021, 8:49 AM

## 2021-01-17 NOTE — Progress Notes (Signed)
Called to room to assist during endoscopic procedure.  Patient ID and intended procedure confirmed with present staff. Received instructions for my participation in the procedure from the performing physician.  

## 2021-01-17 NOTE — Op Note (Addendum)
[facility] Patient Name: Cindy Conner Procedure Date: 01/17/2021 9:18 AM MRN: [id number] Endoscopist: Thornton Park MD, MD Age: 65 Referring MD:  Date of Birth: [birth year] Gender: Female Account #: [id number] Procedure:                Colonoscopy Indications:              Screening for colorectal malignant neoplasm Medicines:                Monitored Anesthesia Care Procedure:                Pre-Anesthesia Assessment:                           - Prior to the procedure, a History and Physical                            was performed, and patient medications and                            allergies were reviewed. The patient's tolerance of                            previous anesthesia was also reviewed. The risks                            and benefits of the procedure and the sedation                            options and risks were discussed with the patient.                            All questions were answered, and informed consent                            was obtained. Prior Anticoagulants: The patient has                            taken no previous anticoagulant or antiplatelet                            agents. ASA Grade Assessment: II - A patient with                            mild systemic disease. After reviewing the risks                            and benefits, the patient was deemed in                            satisfactory condition to undergo the procedure.                           After obtaining informed consent, the colonoscope  was passed under direct vision. Throughout the                            procedure, the patient's blood pressure, pulse, and                            oxygen saturations were monitored continuously. The                            PCF-HQ190L Colonoscope was introduced through the                            anus and advanced to the 3 cm into the ileum. A                            second forward  view of the right colon was                            performed. The colonoscopy was performed with                            moderate difficulty due to a redundant colon.                            Successful completion of the procedure was aided by                            applying abdominal pressure. The patient tolerated                            the procedure well. The quality of the bowel                            preparation was good. The terminal ileum, ileocecal                            valve, appendiceal orifice, and rectum were                            photographed. Scope In: 9:29:55 AM Scope Out: 9:47:18 AM Scope Withdrawal Time: 0 hours 10 minutes 47 seconds  Total Procedure Duration: 0 hours 17 minutes 23 seconds  Findings:                 The perianal and digital rectal examinations were                            normal.                           Non-bleeding internal hemorrhoids were found.                           A few small-mouthed diverticula were found in the  sigmoid colon.                           A less than 1 mm polyp was found in the cecum. The                            polyp was flat. The polyp was removed with a                            piecemeal technique using a cold biopsy forceps.                            Resection and retrieval were complete. Estimated                            blood loss was minimal.                           The exam was otherwise without abnormality on                            direct and retroflexion views. Complications:            No immediate complications. Estimated Blood Loss:     Estimated blood loss: none. Impression:               - Non-bleeding internal hemorrhoids.                           - Very mild diverticulosis in the sigmoid colon.                           - Small cecal polyp. Removed. Recommendation:           - Patient has a contact number available for                             emergencies. The signs and symptoms of potential                            delayed complications were discussed with the                            patient. Return to normal activities tomorrow.                            Written discharge instructions were provided to the                            patient.                           - High fiber diet.                           - Continue present medications.                           -  Await pathology results. Surveillance interval                            will be determined when those results are available.                           - Emerging evidence supports eating a diet of                            fruits, vegetables, grains, calcium, and yogurt                            while reducing red meat and alcohol may reduce the                            risk of colon cancer.                           - Thank you for allowing me to be involved in your                            colon cancer prevention. Thornton Park MD, MD 01/17/2021 9:52:04 AM This report has been signed electronically.

## 2021-01-17 NOTE — Patient Instructions (Signed)
Discharge instructions given. Handouts on polyps,diverticulosis and hemorrhoids. Resume previous medications. YOU HAD AN ENDOSCOPIC PROCEDURE TODAY AT [facility]:   Refer to the procedure report that was given to you for any specific questions about what was found during the examination.  If the procedure report does not answer your questions, please call your gastroenterologist to clarify.  If you requested that your care partner not be given the details of your procedure findings, then the procedure report has been included in a sealed envelope for you to review at your convenience later.  YOU SHOULD EXPECT: Some feelings of bloating in the abdomen. Passage of more gas than usual.  Walking can help get rid of the air that was put into your GI tract during the procedure and reduce the bloating. If you had a lower endoscopy (such as a colonoscopy or flexible sigmoidoscopy) you may notice spotting of blood in your stool or on the toilet paper. If you underwent a bowel prep for your procedure, you may not have a normal bowel movement for a few days.  Please Note:  You might notice some irritation and congestion in your nose or some drainage.  This is from the oxygen used during your procedure.  There is no need for concern and it should clear up in a day or so.  SYMPTOMS TO REPORT IMMEDIATELY:  Following lower endoscopy (colonoscopy or flexible sigmoidoscopy):  Excessive amounts of blood in the stool  Significant tenderness or worsening of abdominal pains  Swelling of the abdomen that is new, acute  Fever of 100F or higher   For urgent or emergent issues, a gastroenterologist can be reached at any hour by calling [phone]. Do not use MyChart messaging for urgent concerns.    DIET:  We do recommend a small meal at first, but then you may proceed to your regular diet.  Drink plenty of fluids but you should avoid alcoholic beverages for 24 hours.  ACTIVITY:  You should  plan to take it easy for the rest of today and you should NOT DRIVE or use heavy machinery until tomorrow (because of the sedation medicines used during the test).    FOLLOW UP: Our staff will call the number listed on your records 48-72 hours following your procedure to check on you and address any questions or concerns that you may have regarding the information given to you following your procedure. If we do not reach you, we will leave a message.  We will attempt to reach you two times.  During this call, we will ask if you have developed any symptoms of COVID 19. If you develop any symptoms (ie: fever, flu-like symptoms, shortness of breath, cough etc.) before then, please call [phone].  If you test positive for Covid 19 in the 2 weeks post procedure, please call and report this information to [place].    If any biopsies were taken you will be contacted by phone or by letter within the next 1-3 weeks.  Please call us at [phone] if you have not heard about the biopsies in 3 weeks.    SIGNATURES/CONFIDENTIALITY: You and/or your care partner have signed paperwork which will be entered into your electronic medical record.  These signatures attest to the fact that that the information above on your After Visit Summary has been reviewed and is understood.  Full responsibility of the confidentiality of this discharge information lies with you and/or your care-partner.

## 2021-01-19 ENCOUNTER — Telehealth: Payer: Self-pay | Admitting: *Deleted

## 2021-01-19 ENCOUNTER — Telehealth: Payer: Self-pay

## 2021-01-19 NOTE — Telephone Encounter (Signed)
  Follow up Call-  Call back number 01/17/2021  Post procedure Call Back phone  # [phone]  Permission to leave phone message Yes  Some recent data might be hidden     Patient questions:  Do you have a fever, pain , or abdominal swelling? No. Pain Score  0 *  Have you tolerated food without any problems? Yes.    Have you been able to return to your normal activities? Yes.    Do you have any questions about your discharge instructions: Diet   No. Medications  No. Follow up visit  No.  Do you have questions or concerns about your Care? No.  Actions: * If pain score is 4 or above: No action needed, pain <4.   Have you developed a fever since your procedure? no  2.   Have you had an respiratory symptoms (SOB or cough) since your procedure? no  3.   Have you tested positive for COVID 19 since your procedure no  4.   Have you had any family members/close contacts diagnosed with the COVID 19 since your procedure?  no   If yes to any of these questions please route to Joylene John, RN and Joella Prince, RN

## 2021-01-19 NOTE — Telephone Encounter (Signed)
Left message on answering machine. 

## 2021-01-21 ENCOUNTER — Encounter: Payer: Self-pay | Admitting: Gastroenterology

## 2021-03-23 ENCOUNTER — Other Ambulatory Visit: Payer: Self-pay | Admitting: Obstetrics and Gynecology

## 2021-03-23 DIAGNOSIS — N6310 Unspecified lump in the right breast, unspecified quadrant: Secondary | ICD-10-CM

## 2021-04-23 ENCOUNTER — Other Ambulatory Visit: Payer: BC Managed Care – PPO

## 2021-05-17 ENCOUNTER — Ambulatory Visit
Admission: RE | Admit: 2021-05-17 | Discharge: 2021-05-17 | Disposition: A | Discharge status: Home / Self Care | Payer: BC Managed Care – PPO | Source: Ambulatory Visit | Attending: Obstetrics and Gynecology | Admitting: Obstetrics and Gynecology

## 2021-05-17 DIAGNOSIS — N6310 Unspecified lump in the right breast, unspecified quadrant: Secondary | ICD-10-CM

## 2021-08-03 ENCOUNTER — Ambulatory Visit: Payer: BC Managed Care – PPO | Admitting: Plastic Surgery

## 2021-10-26 ENCOUNTER — Encounter: Payer: Self-pay | Admitting: Plastic Surgery

## 2021-10-26 ENCOUNTER — Ambulatory Visit (INDEPENDENT_AMBULATORY_CARE_PROVIDER_SITE_OTHER): Payer: BC Managed Care – PPO | Admitting: Plastic Surgery

## 2021-10-26 DIAGNOSIS — L989 Disorder of the skin and subcutaneous tissue, unspecified: Secondary | ICD-10-CM

## 2021-10-26 DIAGNOSIS — Z719 Counseling, unspecified: Secondary | ICD-10-CM

## 2021-10-26 NOTE — Progress Notes (Signed)
Patient ID: Cindy Conner, female    DOB: July 12, 1955, 66 y.o.   MRN: [id number]   Chief Complaint  Patient presents with   Follow-up    The patient is a 66 year old female here for reevaluation of her skin in her breasts.  Her face has healed nicely.  The implants were placed in 2015 or 16.  She had some calcifications that were palpable.  They have been stable and it does not seem to be causing any other problems at this time.  They have a nice appearance and no sign of further issues.    Review of Systems  Constitutional: Negative.   HENT: Negative.    Eyes: Negative.   Respiratory: Negative.    Cardiovascular: Negative.   Gastrointestinal: Negative.   Endocrine: Negative.   Genitourinary: Negative.   Musculoskeletal: Negative.   Skin: Negative.   Hematological: Negative.     Past Medical History:  Diagnosis Date   Arthritis    generalized   Headache(784.0)    Osteopenia    Perimenopausal vasomotor symptoms    Postmenopausal    Stress incontinence, female    Tenesmus    Thyroid disease    on meds    Past Surgical History:  Procedure Laterality Date   AUGMENTATION MAMMAPLASTY Bilateral    MENISCUS REPAIR Left 2016      Current Outpatient Medications:    ALPRAZolam (XANAX) 0.25 MG tablet, Take 0.25 mg by mouth at bedtime as needed., Disp: , Rfl:    BIOTIN PO, Take 1 tablet by mouth daily at 6 (six) AM., Disp: , Rfl:    fluorouracil (EFUDEX) 5 % cream, fluorouracil 5 % topical cream, Disp: , Rfl:    levothyroxine (SYNTHROID) 25 MCG tablet, Take 25 mcg by mouth daily., Disp: , Rfl:    MULTIPLE VITAMINS-CALCIUM PO, Take 2 tablets by mouth daily at 6 (six) AM., Disp: , Rfl:    Multiple Vitamins-Minerals (CENTRUM SILVER 50+WOMEN PO), Take 1 tablet by mouth daily at 6 (six) AM., Disp: , Rfl:    NIACIN PO, Take 500 mg by mouth daily at 6 (six) AM., Disp: , Rfl:    tretinoin (RETIN-A) 0.05 % cream, Apply topically at bedtime., Disp: , Rfl:    TURMERIC PO, Take 1  tablet by mouth daily at 6 (six) AM., Disp: , Rfl:    valACYclovir (VALTREX) 1000 MG tablet, Take 1,000 mg by mouth daily., Disp: , Rfl:   Current Facility-Administered Medications:    0.9 %  sodium chloride infusion, 500 mL, Intravenous, Continuous, Sable Feil, MD   Objective:   There were no vitals filed for this visit.  Physical Exam Constitutional:      Appearance: Normal appearance.  HENT:     Head: Normocephalic.  Cardiovascular:     Rate and Rhythm: Normal rate.     Pulses: Normal pulses.  Pulmonary:     Effort: Pulmonary effort is normal.  Abdominal:     Palpations: Abdomen is soft.  Skin:    General: Skin is warm.     Capillary Refill: Capillary refill takes less than 2 seconds.     Coloration: Skin is not jaundiced.     Findings: No bruising.  Neurological:     Mental Status: She is alert and oriented to person, place, and time.  Psychiatric:        Mood and Affect: Mood normal.        Behavior: Behavior normal.  Thought Content: Thought content normal.     Assessment & Plan:  Encounter for counseling  Skin lesion  Pictures were obtained of the patient and placed in the chart with the patient's or guardian's permission. Continue with massage to the face and breasts. Can continue with skin Uva to any scar areas.  Follow-up as needed.  [place], DO

## 2022-02-19 NOTE — Progress Notes (Deleted)
No chief complaint on file.     History of Present Illness: 66 yo female with history of hypothyroidism who is here today as a new consult, referred by Dr. ***, for the evaluation of tachycardia. ***  Primary Care Physician: Chesley Noon, MD   Past Medical History:  Diagnosis Date   Arthritis    generalized   Headache(784.0)    Osteopenia    Perimenopausal vasomotor symptoms    Postmenopausal    Stress incontinence, female    Tenesmus    Thyroid disease    on meds    Past Surgical History:  Procedure Laterality Date   AUGMENTATION MAMMAPLASTY Bilateral    MENISCUS REPAIR Left 2016    Current Outpatient Medications  Medication Sig Dispense Refill   ALPRAZolam (XANAX) 0.25 MG tablet Take 0.25 mg by mouth at bedtime as needed.     BIOTIN PO Take 1 tablet by mouth daily at 6 (six) AM.     fluorouracil (EFUDEX) 5 % cream fluorouracil 5 % topical cream     levothyroxine (SYNTHROID) 25 MCG tablet Take 25 mcg by mouth daily.     MULTIPLE VITAMINS-CALCIUM PO Take 2 tablets by mouth daily at 6 (six) AM.     Multiple Vitamins-Minerals (CENTRUM SILVER 50+WOMEN PO) Take 1 tablet by mouth daily at 6 (six) AM.     NIACIN PO Take 500 mg by mouth daily at 6 (six) AM.     tretinoin (RETIN-A) 0.05 % cream Apply topically at bedtime.     TURMERIC PO Take 1 tablet by mouth daily at 6 (six) AM.     valACYclovir (VALTREX) 1000 MG tablet Take 1,000 mg by mouth daily.     Current Facility-Administered Medications  Medication Dose Route Frequency Provider Last Rate Last Admin   0.9 %  sodium chloride infusion  500 mL Intravenous Continuous Sable Feil, MD        No Known Allergies  Social History   Socioeconomic History   Marital status: Married    Spouse name: Not on file   Number of children: 2   Years of education: Not on file   Highest education level: Not on file  Occupational History   Occupation: business owner  Tobacco Use   Smoking status: Never    Smokeless tobacco: Never  Vaping Use   Vaping Use: Never used  Substance and Sexual Activity   Alcohol use: Not Currently    Alcohol/week: 3.0 standard drinks of alcohol    Types: 3 Standard drinks or equivalent per week   Drug use: No   Sexual activity: Not on file  Other Topics Concern   Not on file  Social History Narrative   Not on file   Social Determinants of Health   Financial Resource Strain: Not on file  Food Insecurity: Not on file  Transportation Needs: Not on file  Physical Activity: Not on file  Stress: Not on file  Social Connections: Not on file  Intimate Partner Violence: Not on file    Family History  Problem Relation Age of Onset   Hypertension Father    Hyperlipidemia Father    Colon cancer Cousin    Colon polyps Neg Hx    Esophageal cancer Neg Hx    Rectal cancer Neg Hx    Stomach cancer Neg Hx     Review of Systems:  As stated in the HPI and otherwise negative.   There were no vitals taken for this visit.  Physical Examination: General: Well developed, well nourished, NAD  HEENT: OP clear, mucus membranes moist  SKIN: warm, dry. No rashes. Neuro: No focal deficits  Musculoskeletal: Muscle strength 5/5 all ext  Psychiatric: Mood and affect normal  Neck: No JVD, no carotid bruits, no thyromegaly, no lymphadenopathy.  Lungs:Clear bilaterally, no wheezes, rhonci, crackles Cardiovascular: Regular rate and rhythm. No murmurs, gallops or rubs. Abdomen:Soft. Bowel sounds present. Non-tender.  Extremities: No lower extremity edema. Pulses are 2 + in the bilateral DP/PT.  EKG:  EKG {ACTION; IS/IS [id number]} ordered today. The ekg ordered today demonstrates ***  Recent Labs: No results found for requested labs within last 365 days.   Lipid Panel No results found for: "CHOL", "TRIG", "HDL", "CHOLHDL", "VLDL", "LDLCALC", "LDLDIRECT"   Wt Readings from Last 3 Encounters:  01/17/21 120 lb (54.4 kg)  01/03/21 120 lb (54.4 kg)  08/01/20 120 lb  9.6 oz (54.7 kg)      Assessment and Plan:   1.   Labs/ tests ordered today include:  No orders of the defined types were placed in this encounter.    Disposition:   F/U with me in ***    Signed, Lauree Chandler, MD, [facility] 02/19/2022 5:18 PM    [facility] [address], [place], [place]  [address] Phone: [phone]; Fax: [phone]

## 2022-02-20 ENCOUNTER — Ambulatory Visit: Payer: BC Managed Care – PPO | Attending: Cardiovascular Disease | Admitting: Cardiovascular Disease

## 2022-04-16 ENCOUNTER — Other Ambulatory Visit: Payer: Self-pay | Admitting: Obstetrics and Gynecology

## 2022-04-16 DIAGNOSIS — Z1231 Encounter for screening mammogram for malignant neoplasm of breast: Secondary | ICD-10-CM

## 2022-04-21 NOTE — Progress Notes (Unsigned)
No chief complaint on file.  History of Present Illness: 67 yo female with history of hypothyroidism who is here today as a new consult, referred by Dr. ***, for the evaluation of ***  Primary Care Physician: Chesley Noon, MD   Past Medical History:  Diagnosis Date   Arthritis    generalized   Headache(784.0)    Osteopenia    Perimenopausal vasomotor symptoms    Postmenopausal    Stress incontinence, female    Tenesmus    Thyroid disease    on meds    Past Surgical History:  Procedure Laterality Date   AUGMENTATION MAMMAPLASTY Bilateral    MENISCUS REPAIR Left 2016    Current Outpatient Medications  Medication Sig Dispense Refill   ALPRAZolam (XANAX) 0.25 MG tablet Take 0.25 mg by mouth at bedtime as needed.     BIOTIN PO Take 1 tablet by mouth daily at 6 (six) AM.     fluorouracil (EFUDEX) 5 % cream fluorouracil 5 % topical cream     levothyroxine (SYNTHROID) 25 MCG tablet Take 25 mcg by mouth daily.     MULTIPLE VITAMINS-CALCIUM PO Take 2 tablets by mouth daily at 6 (six) AM.     Multiple Vitamins-Minerals (CENTRUM SILVER 50+WOMEN PO) Take 1 tablet by mouth daily at 6 (six) AM.     NIACIN PO Take 500 mg by mouth daily at 6 (six) AM.     tretinoin (RETIN-A) 0.05 % cream Apply topically at bedtime.     TURMERIC PO Take 1 tablet by mouth daily at 6 (six) AM.     valACYclovir (VALTREX) 1000 MG tablet Take 1,000 mg by mouth daily.     Current Facility-Administered Medications  Medication Dose Route Frequency Provider Last Rate Last Admin   0.9 %  sodium chloride infusion  500 mL Intravenous Continuous Sable Feil, MD        No Known Allergies  Social History   Socioeconomic History   Marital status: Married    Spouse name: Not on file   Number of children: 2   Years of education: Not on file   Highest education level: Not on file  Occupational History   Occupation: business owner  Tobacco Use   Smoking status: Never   Smokeless tobacco: Never   Vaping Use   Vaping Use: Never used  Substance and Sexual Activity   Alcohol use: Not Currently    Alcohol/week: 3.0 standard drinks of alcohol    Types: 3 Standard drinks or equivalent per week   Drug use: No   Sexual activity: Not on file  Other Topics Concern   Not on file  Social History Narrative   Not on file   Social Determinants of Health   Financial Resource Strain: Not on file  Food Insecurity: Not on file  Transportation Needs: Not on file  Physical Activity: Not on file  Stress: Not on file  Social Connections: Not on file  Intimate Partner Violence: Not on file    Family History  Problem Relation Age of Onset   Hypertension Father    Hyperlipidemia Father    Colon cancer Cousin    Colon polyps Neg Hx    Esophageal cancer Neg Hx    Rectal cancer Neg Hx    Stomach cancer Neg Hx     Review of Systems:  As stated in the HPI and otherwise negative.   There were no vitals taken for this visit.  Physical Examination: General: Well  developed, well nourished, NAD  HEENT: OP clear, mucus membranes moist  SKIN: warm, dry. No rashes. Neuro: No focal deficits  Musculoskeletal: Muscle strength 5/5 all ext  Psychiatric: Mood and affect normal  Neck: No JVD, no carotid bruits, no thyromegaly, no lymphadenopathy.  Lungs:Clear bilaterally, no wheezes, rhonci, crackles Cardiovascular: Regular rate and rhythm. No murmurs, gallops or rubs. Abdomen:Soft. Bowel sounds present. Non-tender.  Extremities: No lower extremity edema. Pulses are 2 + in the bilateral DP/PT.  EKG:  EKG {ACTION; IS/IS [id number]} ordered today. The ekg ordered today demonstrates ***  Recent Labs: No results found for requested labs within last 365 days.   Lipid Panel No results found for: "CHOL", "TRIG", "HDL", "CHOLHDL", "VLDL", "LDLCALC", "LDLDIRECT"   Wt Readings from Last 3 Encounters:  01/17/21 54.4 kg  01/03/21 54.4 kg  08/01/20 54.7 kg      Assessment and Plan:   1.    Labs/ tests ordered today include:  No orders of the defined types were placed in this encounter.    Disposition:   F/U with me in ***    Signed, Lauree Chandler, MD, [facility] 04/21/2022 1:48 PM    [facility] [place], [place], [place]  [address] Phone: [phone]; Fax: [phone]

## 2022-04-22 ENCOUNTER — Ambulatory Visit: Payer: Managed Care, Other (non HMO) | Attending: Cardiovascular Disease | Admitting: Cardiovascular Disease

## 2022-04-22 ENCOUNTER — Encounter: Payer: Self-pay | Admitting: Cardiovascular Disease

## 2022-04-22 ENCOUNTER — Ambulatory Visit: Payer: Managed Care, Other (non HMO) | Attending: Cardiovascular Disease

## 2022-04-22 VITALS — BP 118/84 | HR 62 | Ht 69.0 in | Wt 116.6 lb

## 2022-04-22 DIAGNOSIS — R002 Palpitations: Secondary | ICD-10-CM | POA: Diagnosis not present

## 2022-04-22 NOTE — Patient Instructions (Addendum)
Medication Instructions:  No changes *If you need a refill on your cardiac medications before your next appointment, please call your pharmacy*   Lab Work: none   Testing/Procedures: 14 Day Zio Patch - see instructions below  Your physician has requested that you have an echocardiogram. Echocardiography is a painless test that uses sound waves to create images of your heart. It provides your doctor with information about the size and shape of your heart and how well your heart's chambers and valves are working. This procedure takes approximately one hour. There are no restrictions for this procedure. Please do NOT wear cologne, perfume, aftershave, or lotions (deodorant is allowed). Please arrive 15 minutes prior to your appointment time.   Follow-Up: As needed   ZIO XT- Long Term Monitor Instructions  Your physician has requested you wear a ZIO patch monitor for 14 days.  This is a single patch monitor. [employer] supplies one patch monitor per enrollment. Additional stickers are not available. Please do not apply patch if you will be having a Nuclear Stress Test,  Echocardiogram, Cardiac CT, MRI, or Chest Xray during the period you would be wearing the  monitor. The patch cannot be worn during these tests. You cannot remove and re-apply the  ZIO XT patch monitor.  Your ZIO patch monitor will be mailed 3 day USPS to your address on file. It may take 3-5 days  to receive your monitor after you have been enrolled.  Once you have received your monitor, please review the enclosed instructions. Your monitor  has already been registered assigning a specific monitor serial # to you.  Billing and Patient Assistance Program Information  We have supplied [employer] with any of your insurance information on file for billing purposes. [employer] offers a sliding scale Patient Assistance Program for patients that do not have  insurance, or whose insurance does not completely cover the cost of the  ZIO monitor.  You must apply for the Patient Assistance Program to qualify for this discounted rate.  To apply, please call [employer] at [phone], select option 4, select option 2, ask to apply for  Patient Assistance Program. Theodore Demark will ask your household income, and how many people  are in your household. They will quote your out-of-pocket cost based on that information.  [employer] will also be able to set up a 80-month interest-free payment plan if needed.  Applying the monitor   Shave hair from upper left chest.  Hold abrader disc by orange tab. Rub abrader in 40 strokes over the upper left chest as  indicated in your monitor instructions.  Clean area with 4 enclosed alcohol pads. Let dry.  Apply patch as indicated in monitor instructions. Patch will be placed under collarbone on left  side of chest with arrow pointing upward.  Rub patch adhesive wings for 2 minutes. Remove white label marked "1". Remove the white  label marked "2". Rub patch adhesive wings for 2 additional minutes.  While looking in a mirror, press and release button in center of patch. A small green light will  flash 3-4 times. This will be your only indicator that the monitor has been turned on.  Do not shower for the first 24 hours. You may shower after the first 24 hours.  Press the button if you feel a symptom. You will hear a small click. Record Date, Time and  Symptom in the Patient Logbook.  When you are ready to remove the patch, follow instructions on the last 2 pages of  Patient  Logbook. Stick patch monitor onto the last page of Patient Logbook.  Place Patient Logbook in the blue and white box. Use locking tab on box and tape box closed  securely. The blue and white box has prepaid postage on it. Please place it in the mailbox as  soon as possible. Your physician should have your test results approximately 7 days after the  monitor has been mailed back to [facility].  Call [place] at [phone] if you have questions regarding  your ZIO XT patch monitor. Call them immediately if you see an orange light blinking on your  monitor.  If your monitor falls off in less than 4 days, contact our Monitor department at [phone].  If your monitor becomes loose or falls off after 4 days call [employer] at [phone] for  suggestions on securing your monitor

## 2022-04-22 NOTE — Progress Notes (Unsigned)
Enrolled for [employer] to mail a ZIO XT long term holter monitor to the patients address on file.  

## 2022-04-23 ENCOUNTER — Ambulatory Visit (HOSPITAL_COMMUNITY): Payer: Managed Care, Other (non HMO) | Attending: Cardiovascular Disease

## 2022-04-23 DIAGNOSIS — I351 Nonrheumatic aortic (valve) insufficiency: Secondary | ICD-10-CM | POA: Diagnosis not present

## 2022-04-23 DIAGNOSIS — R002 Palpitations: Secondary | ICD-10-CM | POA: Insufficient documentation

## 2022-04-23 LAB — ECHOCARDIOGRAM COMPLETE
Area-P 1/2: 2.88 cm2
P 1/2 time: 458 msec
S' Lateral: 3 cm

## 2022-04-25 DIAGNOSIS — R002 Palpitations: Secondary | ICD-10-CM

## 2022-04-26 ENCOUNTER — Telehealth: Payer: Self-pay | Admitting: Cardiovascular Disease

## 2022-04-26 NOTE — Telephone Encounter (Signed)
Patient returned RN's call regarding results.

## 2022-04-29 NOTE — Telephone Encounter (Signed)
Left message for patient to call back.

## 2022-05-06 NOTE — Telephone Encounter (Signed)
  Patient is returning call regarding results

## 2022-05-06 NOTE — Telephone Encounter (Signed)
Reviewed results and recommendations/comments from Dr Angelena Form. Patient verbalized understanding and had no questions.

## 2022-05-17 ENCOUNTER — Telehealth: Payer: Self-pay | Admitting: *Deleted

## 2022-05-17 MED ORDER — DILTIAZEM HCL 30 MG PO TABS: 30.0000 mg | ORAL_TABLET | Freq: Four times a day (QID) | ORAL | 1 refills | Status: AC | PRN

## 2022-05-17 NOTE — Telephone Encounter (Signed)
-----   Message from Burnell Blanks, MD sent at 05/15/2022 11:42 AM EST ----- Several brief runs of SVT. Can we let her know that this is benign. She may feel her heart race when this happens. Nothing lasted over several seconds. We can give her Cardizem 30 mg to use as needed for palpitations. Also review vagal maneuvers that she can use if she feels her heart racing (bear down and cough deeply). Thanks, chris

## 2022-05-17 NOTE — Telephone Encounter (Signed)
Reviewed with patient.  All questions/concerns have been addressed.  She will call back if anything further is needed.  Cardizem sent #30 w one refill.

## 2022-06-05 ENCOUNTER — Ambulatory Visit
Admission: RE | Admit: 2022-06-05 | Discharge: 2022-06-05 | Disposition: A | Discharge status: Home / Self Care | Payer: Managed Care, Other (non HMO) | Source: Ambulatory Visit | Attending: Obstetrics and Gynecology | Admitting: Obstetrics and Gynecology

## 2022-06-05 DIAGNOSIS — Z1231 Encounter for screening mammogram for malignant neoplasm of breast: Secondary | ICD-10-CM

## 2022-09-01 IMAGING — MG MM  DIGITAL DIAGNOSTIC BREAST BILAT IMPLANT W/ TOMO W/ CAD
8 of 12 series · 8 of 28 positions shown · non-contrast
Comparison: Previous exam(s).

CLINICAL DATA: Patient and provider note "multiple nodules"
enlarging in the right breast. Known large dystrophic calcifications
in the right breast, documented on prior diagnostic exam 03/26/2019.

EXAM:
DIGITAL DIAGNOSTIC BILATERAL MAMMOGRAM WITH IMPLANTS, CAD AND
TOMOSYNTHESIS; ULTRASOUND RIGHT BREAST LIMITED
TECHNIQUE: Bilateral digital diagnostic mammography and breast tomosynthesis
was performed. The images were evaluated with computer-aided
detection. Standard and/or implant displaced views were performed.;
Targeted ultrasound examination of the right breast was performed

[R MLO]
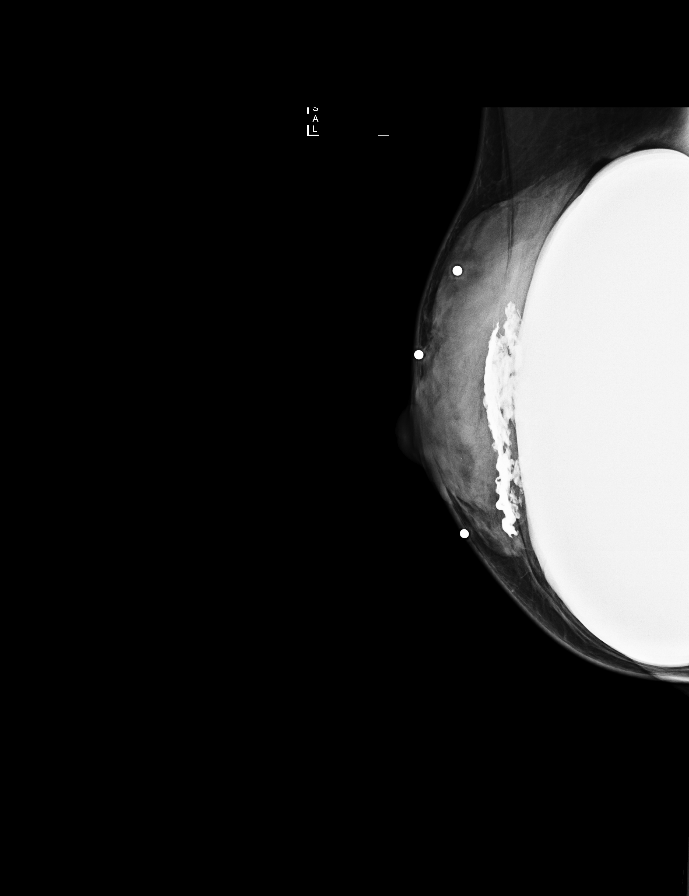

[L CC]
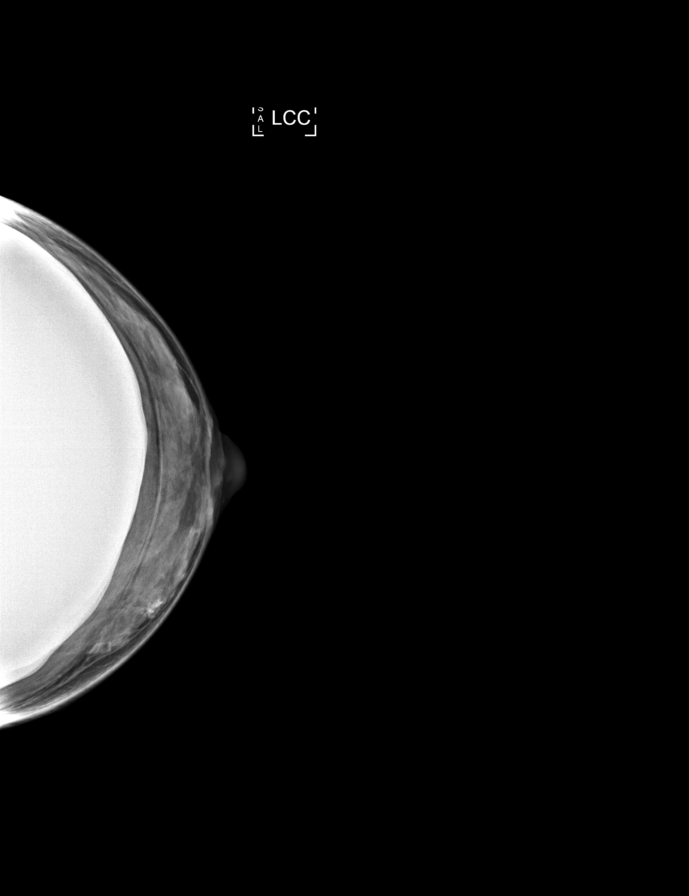

[R CC]
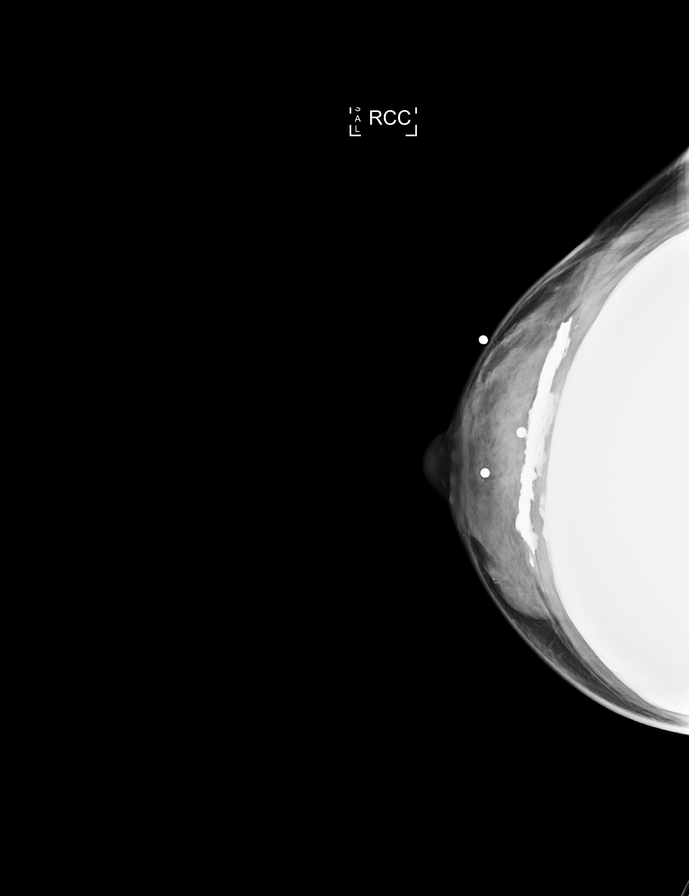

[L MLO]
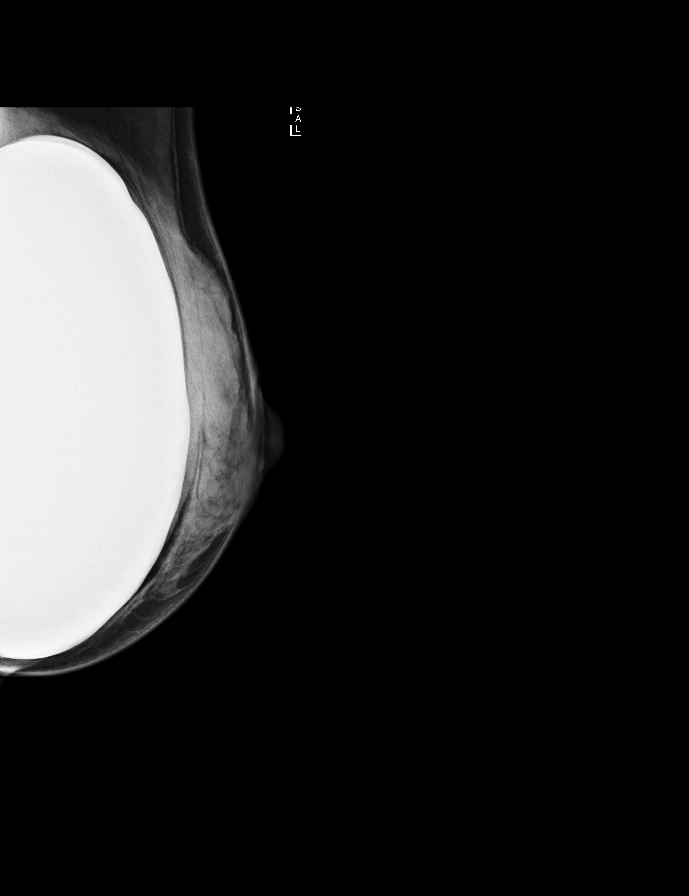

[L CC synth-2D]
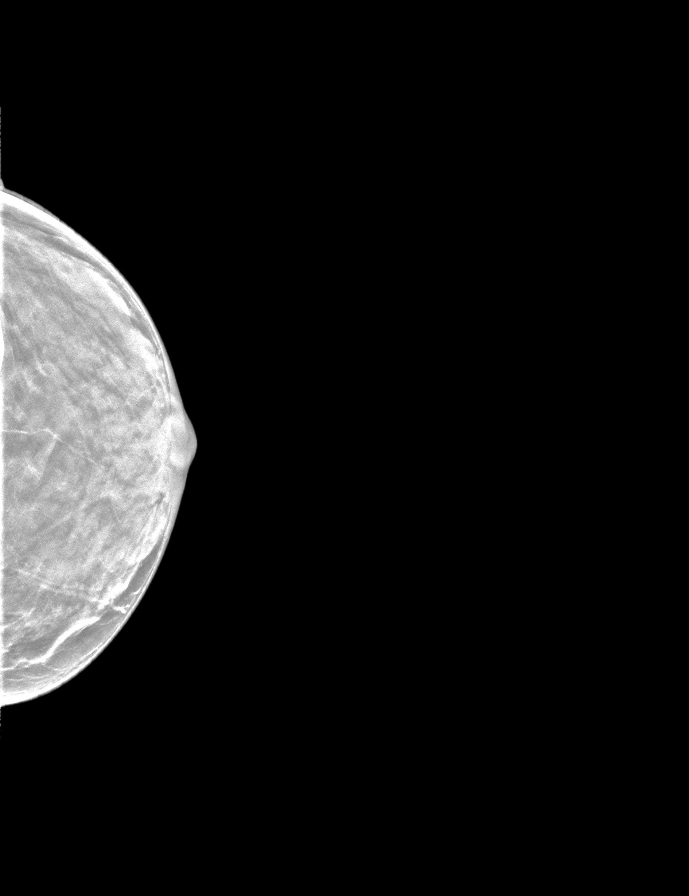

[R CC synth-2D]
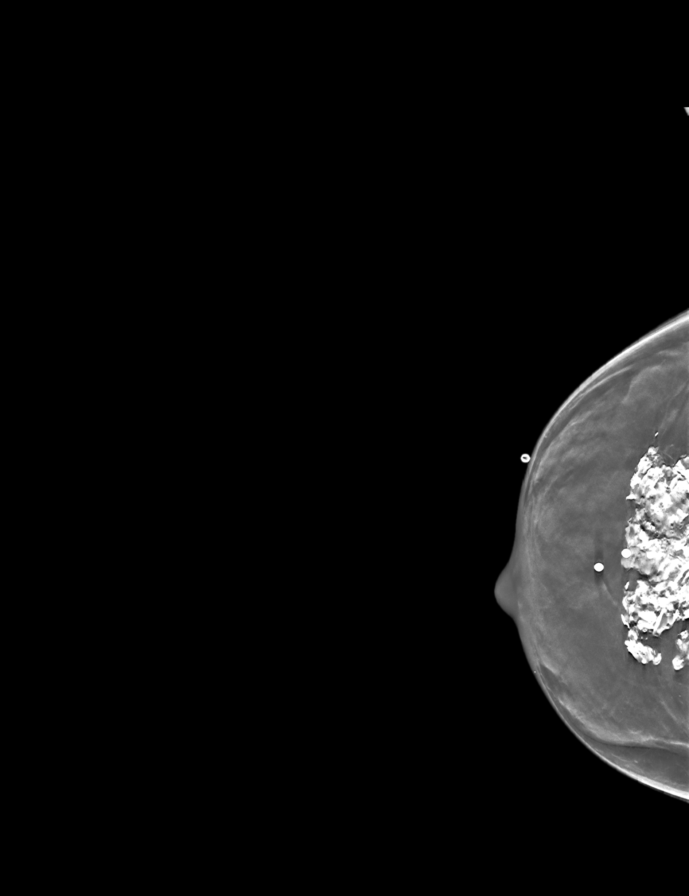

[L MLO synth-2D]
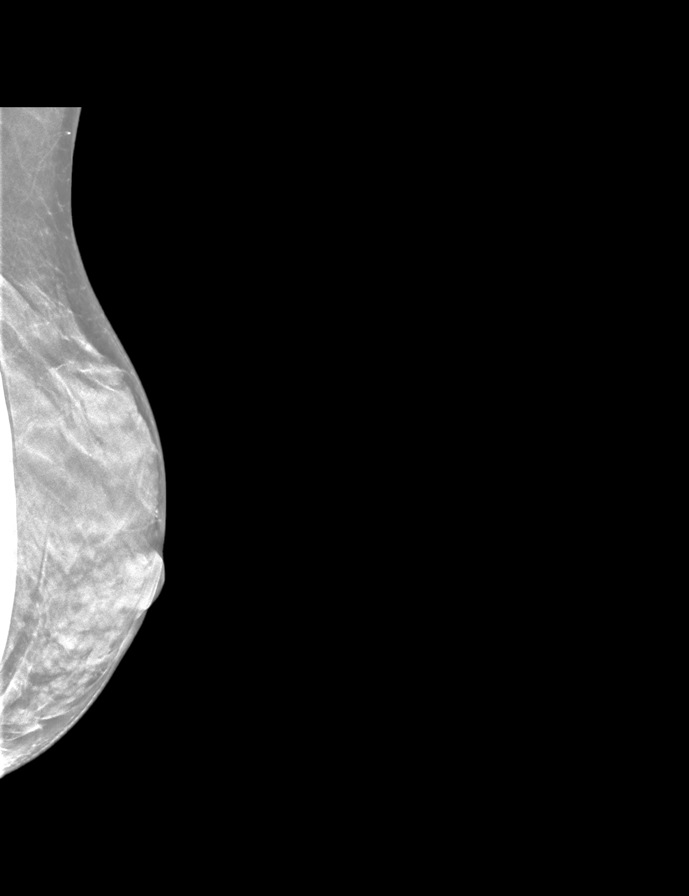

[R MLO synth-2D]
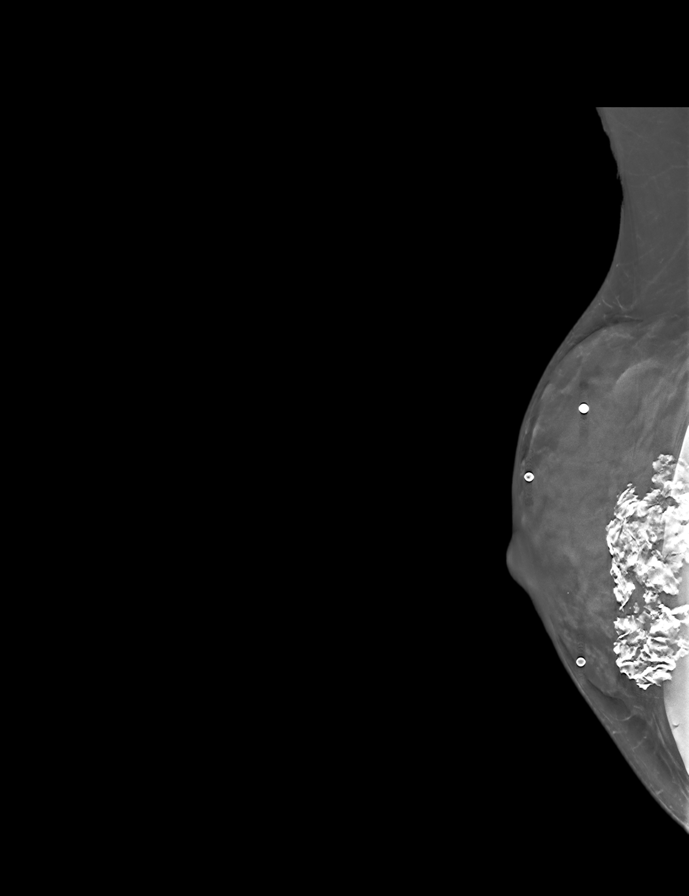

[8 of 28 positions shown; findings below may reference images not displayed]

ACR Breast Density Category d: The breast tissue is extremely dense,
which lowers the sensitivity of mammography.
FINDINGS: The patient has retropectoral implants.

RIGHT breast: Three metallic BBs were applied to the skin, denoting
the patient indicated areas of palpable abnormalities in the upper
central and lower breast. Directly underlying the skin markers is a
large conglomerate of dystrophic calcifications, which has slightly
increased size compared to prior exam. No suspicious mass,
calcifications or other findings.

On physical examination, I appreciate a hard platelike area in the
retroareolar right breast, at the patient indicated area of palpable
abnormalities.

Targeted ultrasound of the right breast 12 o'clock position 3 cm
from nipple, 9 o'clock position 2 cm from the nipple, and 6 o'clock
position 3 cm from the nipple was performed. Shadowing
calcifications are noted at all three areas, corresponding to the
mammographic finding. No suspicious solid or cystic mass.

LEFT breast: No suspicious mass, calcifications, or other findings
in the left breast.
IMPRESSION: 1. A large conglomerate of dystrophic calcifications corresponds to
patient's palpable findings in the right breast.
2. No mammographic findings of malignancy in either breast.

RECOMMENDATION:
Any further workup of the patient's symptoms should be based on the
clinical assessment. Recommend routine annual screening mammogram in
1 year.

I have discussed the findings and recommendations with the patient.
If applicable, a reminder letter will be sent to the patient
regarding the next appointment.

BI-RADS CATEGORY  2: Benign.

## 2023-01-30 DIAGNOSIS — H524 Presbyopia: Secondary | ICD-10-CM | POA: Diagnosis not present

## 2023-01-30 DIAGNOSIS — H16223 Keratoconjunctivitis sicca, not specified as Sjogren's, bilateral: Secondary | ICD-10-CM | POA: Diagnosis not present

## 2023-02-03 DIAGNOSIS — Z711 Person with feared health complaint in whom no diagnosis is made: Secondary | ICD-10-CM | POA: Diagnosis not present

## 2023-02-18 DIAGNOSIS — L718 Other rosacea: Secondary | ICD-10-CM | POA: Diagnosis not present

## 2023-02-18 DIAGNOSIS — L821 Other seborrheic keratosis: Secondary | ICD-10-CM | POA: Diagnosis not present

## 2023-02-18 DIAGNOSIS — D225 Melanocytic nevi of trunk: Secondary | ICD-10-CM | POA: Diagnosis not present

## 2023-02-18 DIAGNOSIS — L814 Other melanin hyperpigmentation: Secondary | ICD-10-CM | POA: Diagnosis not present

## 2023-02-25 DIAGNOSIS — L239 Allergic contact dermatitis, unspecified cause: Secondary | ICD-10-CM | POA: Diagnosis not present

## 2023-02-28 DIAGNOSIS — L249 Irritant contact dermatitis, unspecified cause: Secondary | ICD-10-CM | POA: Diagnosis not present

## 2023-03-31 DIAGNOSIS — K080 Exfoliation of teeth due to systemic causes: Secondary | ICD-10-CM | POA: Diagnosis not present

## 2023-04-03 DIAGNOSIS — K080 Exfoliation of teeth due to systemic causes: Secondary | ICD-10-CM | POA: Diagnosis not present

## 2023-04-14 DIAGNOSIS — Z681 Body mass index (BMI) 19 or less, adult: Secondary | ICD-10-CM | POA: Diagnosis not present

## 2023-04-14 DIAGNOSIS — K080 Exfoliation of teeth due to systemic causes: Secondary | ICD-10-CM | POA: Diagnosis not present

## 2023-04-14 DIAGNOSIS — Z01419 Encounter for gynecological examination (general) (routine) without abnormal findings: Secondary | ICD-10-CM | POA: Diagnosis not present

## 2023-04-14 DIAGNOSIS — Z1231 Encounter for screening mammogram for malignant neoplasm of breast: Secondary | ICD-10-CM | POA: Diagnosis not present

## 2023-04-15 DIAGNOSIS — Z131 Encounter for screening for diabetes mellitus: Secondary | ICD-10-CM | POA: Diagnosis not present

## 2023-04-15 DIAGNOSIS — E785 Hyperlipidemia, unspecified: Secondary | ICD-10-CM | POA: Diagnosis not present

## 2023-04-15 DIAGNOSIS — Z1321 Encounter for screening for nutritional disorder: Secondary | ICD-10-CM | POA: Diagnosis not present

## 2023-04-15 DIAGNOSIS — Z13228 Encounter for screening for other metabolic disorders: Secondary | ICD-10-CM | POA: Diagnosis not present

## 2023-04-15 DIAGNOSIS — E039 Hypothyroidism, unspecified: Secondary | ICD-10-CM | POA: Diagnosis not present

## 2023-04-29 ENCOUNTER — Encounter: Payer: Self-pay | Admitting: Cardiovascular Disease

## 2023-04-29 ENCOUNTER — Ambulatory Visit: Payer: Medicare Other | Attending: Cardiovascular Disease | Admitting: Cardiovascular Disease

## 2023-04-29 VITALS — BP 130/78 | HR 67 | Ht 69.0 in | Wt 115.4 lb

## 2023-04-29 DIAGNOSIS — I340 Nonrheumatic mitral (valve) insufficiency: Secondary | ICD-10-CM

## 2023-04-29 DIAGNOSIS — R002 Palpitations: Secondary | ICD-10-CM

## 2023-04-29 DIAGNOSIS — I351 Nonrheumatic aortic (valve) insufficiency: Secondary | ICD-10-CM

## 2023-04-29 DIAGNOSIS — I471 Supraventricular tachycardia, unspecified: Secondary | ICD-10-CM

## 2023-04-29 NOTE — Patient Instructions (Signed)
Medication Instructions:  No changes *If you need a refill on your cardiac medications before your next appointment, please call your pharmacy*   Lab Work: none   Testing/Procedures: none   Follow-Up: At [facility], you and your health needs are our priority.  As part of our continuing mission to provide you with exceptional heart care, we have created designated Provider Care Teams.  These Care Teams include your primary Cardiologist (physician) and Advanced Practice Providers (APPs -  Physician Assistants and Nurse Practitioners) who all work together to provide you with the care you need, when you need it.   Your next appointment:   12 month(s)  Provider:   Christopher McAlhany, MD   

## 2023-04-29 NOTE — Progress Notes (Signed)
 Chief Complaint  Patient presents with   Follow-up    SVT   History of Present Illness: 68 yo female with history of hypothyroidism, mild AI, mild MR, SVT who is here today for follow up. I saw her as a new patient in January 2024 for the evaluation of palpitations.  She noticed her heart racing in November 2023. She was on a girls trip and had been drinking alcohol. The palpitations lasted for several hours. Her heart rate was not elevated. Echo January 2024 with LVEF=60-65%. Mild MR. Mild AI. Cardiac monitor January 2024 with sinus rhythm, several short runs of SVT, longest 8 beats, rare PACs, rare PVCs. She was given short acting Cardizem  to use as needed for palpitations.   She is here today for follow up. The patient denies any chest pain, dyspnea, lower extremity edema, orthopnea, PND, dizziness, near syncope or syncope. Rare palpitations.   Primary Care Physician: Sophronia Ozell BROCKS, MD   Past Medical History:  Diagnosis Date   Arthritis    generalized   Headache(784.0)    Osteopenia    Perimenopausal vasomotor symptoms    Postmenopausal    Stress incontinence, female    Tenesmus    Thyroid  disease    on meds    Past Surgical History:  Procedure Laterality Date   AUGMENTATION MAMMAPLASTY Bilateral    MENISCUS REPAIR Left 2016    Current Outpatient Medications  Medication Sig Dispense Refill   ALPRAZolam (XANAX) 0.25 MG tablet Take 0.25 mg by mouth at bedtime as needed.     Azelaic Acid 15 % gel Apply topically at bedtime as needed.     BIOTIN PO Take 1 tablet by mouth daily at 6 (six) AM.     Cholecalciferol 1.25 MG (50000 UT) capsule Take 50,000 Units by mouth daily.     diltiazem  (CARDIZEM ) 30 MG tablet Take 1 tablet (30 mg total) by mouth every 6 (six) hours as needed. 30 tablet 1   fluorouracil (EFUDEX) 5 % cream fluorouracil 5 % topical cream     levothyroxine (SYNTHROID) 25 MCG tablet Take 25 mcg by mouth daily.     MULTIPLE VITAMINS-CALCIUM PO Take 2 tablets  by mouth daily at 6 (six) AM.     Multiple Vitamins-Minerals (CENTRUM SILVER 50+WOMEN PO) Take 1 tablet by mouth daily at 6 (six) AM.     NIACIN PO Take 500 mg by mouth daily at 6 (six) AM.     tretinoin (RETIN-A) 0.05 % cream Apply topically at bedtime.     TURMERIC PO Take 1 tablet by mouth daily at 6 (six) AM.     valACYclovir (VALTREX) 1000 MG tablet Take 500 mg by mouth every other day.     Current Facility-Administered Medications  Medication Dose Route Frequency Provider Last Rate Last Admin   0.9 %  sodium chloride  infusion  500 mL Intravenous Continuous Jakie Alm SAUNDERS, MD        No Known Allergies  Social History   Socioeconomic History   Marital status: Married    Spouse name: Not on file   Number of children: 2   Years of education: Not on file   Highest education level: Not on file  Occupational History   Occupation: business owner-post mastectomy clothing  Tobacco Use   Smoking status: Never   Smokeless tobacco: Never  Vaping Use   Vaping status: Never Used  Substance and Sexual Activity   Alcohol use: Not Currently    Alcohol/week: 3.0 standard  drinks of alcohol    Types: 3 Standard drinks or equivalent per week   Drug use: No   Sexual activity: Not on file  Other Topics Concern   Not on file  Social History Narrative   Not on file   Social Drivers of Health   Financial Resource Strain: Low Risk  (05/27/2022)   Received from [facility], [facility]   Overall Financial Resource Strain (CARDIA)    Difficulty of Paying Living Expenses: Not hard at all  Food Insecurity: No Food Insecurity (05/27/2022)   Received from [facility], [facility]   Hunger Vital Sign    Worried About Running Out of Food in the Last Year: Never true    Ran Out of Food in the Last Year: Never true  Transportation Needs: No Transportation Needs (05/27/2022)   Received from [facility], [facility]   PRAPARE - Transportation    Lack of Transportation (Medical): No     Lack of Transportation (Non-Medical): No  Physical Activity: Sufficiently Active (09/03/2021)   Received from [facility], [facility]   Exercise Vital Sign    Days of Exercise per Week: 4 days    Minutes of Exercise per Session: 40 min  Stress: No Stress Concern Present (09/03/2021)   Received from [facility], [facility] - Occupational Stress Questionnaire    Feeling of Stress : Only a little  Social Connections: Unknown (09/20/2022)   Received from [facility]   Social Network    Social Network: Not on file  Intimate Partner Violence: Unknown (09/20/2022)   Received from [facility]   HITS    Physically Hurt: Not on file    Insult or Talk Down To: Not on file    Threaten Physical Harm: Not on file    Scream or Curse: Not on file    Family History  Problem Relation Age of Onset   Hypertension Father    Hyperlipidemia Father    Colon cancer Cousin    Colon polyps Neg Hx    Esophageal cancer Neg Hx    Rectal cancer Neg Hx    Stomach cancer Neg Hx     Review of Systems:  As stated in the HPI and otherwise negative.   BP 130/78   Pulse 67   Ht 5' 9 (1.753 m)   Wt 52.3 kg   SpO2 98%   BMI 17.04 kg/m   Physical Examination: General: Well developed, well nourished, NAD  HEENT: OP clear, mucus membranes moist  SKIN: warm, dry. No rashes. Neuro: No focal deficits  Musculoskeletal: Muscle strength 5/5 all ext  Psychiatric: Mood and affect normal  Neck: No JVD, no carotid bruits, no thyromegaly, no lymphadenopathy.  Lungs:Clear bilaterally, no wheezes, rhonci, crackles Cardiovascular: Regular rate and rhythm. No murmurs, gallops or rubs. Abdomen:Soft. Bowel sounds present. Non-tender.  Extremities: No lower extremity edema. Pulses are 2 + in the bilateral DP/PT.  EKG:  EKG is  ordered today. The ekg ordered today demonstrates  EKG Interpretation Date/Time:  Tuesday April 29 2023 11:46:06 EST Ventricular Rate:   62 PR Interval:  152 QRS Duration:  80 QT Interval:  410 QTC Calculation: 416 R Axis:   79  Text Interpretation: Normal sinus rhythm Normal ECG No previous ECGs available Confirmed by Verlin Bruckner [phone]) on 04/29/2023 11:53:01 AM    Recent Labs: No results found for requested labs within last 365 days.   Lipid Panel No results found for:  CHOL, TRIG, HDL, CHOLHDL, VLDL, LDLCALC, LDLDIRECT   Wt Readings from Last 3 Encounters:  04/29/23 52.3 kg  04/22/22 52.9 kg  01/17/21 54.4 kg    Assessment and Plan:   1. SVT: Rare palpitations. Episodes break quickly with vagal maneuvers. She occasionally uses short acting Diltiazem . For now, will continue with current plan. If she has increased frequency of episodes, will arrange a monitor and start long acting Cardizem .   2. Aortic valve insufficiency: Mild by echo in January 2024. Repeat echo in January 2026.   3. Mitral valve regurgitation: Mild by echo in January 2024. Repeat echo in January 2026.   Labs/ tests ordered today include:   Orders Placed This Encounter  Procedures   EKG 12-Lead   Disposition:   F/U with me in 12 months  Signed, Lonni Cash, MD, [facility] 04/29/2023 12:26 PM    [facility] [address] [place], [place], [place]  [address] Phone: [phone]; Fax: [phone]

## 2023-05-05 DIAGNOSIS — I83893 Varicose veins of bilateral lower extremities with other complications: Secondary | ICD-10-CM | POA: Diagnosis not present

## 2023-05-05 DIAGNOSIS — M79662 Pain in left lower leg: Secondary | ICD-10-CM | POA: Diagnosis not present

## 2023-05-05 DIAGNOSIS — R252 Cramp and spasm: Secondary | ICD-10-CM | POA: Diagnosis not present

## 2023-05-05 DIAGNOSIS — M79604 Pain in right leg: Secondary | ICD-10-CM | POA: Diagnosis not present

## 2023-05-28 DIAGNOSIS — I83891 Varicose veins of right lower extremities with other complications: Secondary | ICD-10-CM | POA: Diagnosis not present

## 2023-06-02 DIAGNOSIS — I83891 Varicose veins of right lower extremities with other complications: Secondary | ICD-10-CM | POA: Diagnosis not present

## 2023-06-11 DIAGNOSIS — I8001 Phlebitis and thrombophlebitis of superficial vessels of right lower extremity: Secondary | ICD-10-CM | POA: Diagnosis not present

## 2023-06-12 DIAGNOSIS — I83892 Varicose veins of left lower extremities with other complications: Secondary | ICD-10-CM | POA: Diagnosis not present

## 2023-06-18 DIAGNOSIS — I83891 Varicose veins of right lower extremities with other complications: Secondary | ICD-10-CM | POA: Diagnosis not present

## 2023-07-01 DIAGNOSIS — K080 Exfoliation of teeth due to systemic causes: Secondary | ICD-10-CM | POA: Diagnosis not present

## 2023-09-16 DIAGNOSIS — R252 Cramp and spasm: Secondary | ICD-10-CM | POA: Diagnosis not present

## 2023-09-16 DIAGNOSIS — I872 Venous insufficiency (chronic) (peripheral): Secondary | ICD-10-CM | POA: Diagnosis not present

## 2023-09-16 DIAGNOSIS — I87393 Chronic venous hypertension (idiopathic) with other complications of bilateral lower extremity: Secondary | ICD-10-CM | POA: Diagnosis not present

## 2024-02-04 ENCOUNTER — Other Ambulatory Visit: Payer: Self-pay | Admitting: Obstetrics and Gynecology

## 2024-02-04 DIAGNOSIS — N6312 Unspecified lump in the right breast, upper inner quadrant: Secondary | ICD-10-CM

## 2024-03-02 ENCOUNTER — Ambulatory Visit
Admission: RE | Admit: 2024-03-02 | Discharge: 2024-03-02 | Disposition: A | Source: Ambulatory Visit | Attending: Obstetrics and Gynecology | Admitting: Obstetrics and Gynecology

## 2024-03-02 ENCOUNTER — Ambulatory Visit
Admission: RE | Admit: 2024-03-02 | Discharge: 2024-03-02 | Disposition: A | Discharge status: Home / Self Care | Source: Ambulatory Visit | Attending: Obstetrics and Gynecology | Admitting: Obstetrics and Gynecology

## 2024-03-02 DIAGNOSIS — N6312 Unspecified lump in the right breast, upper inner quadrant: Secondary | ICD-10-CM
